# Patient Record
Sex: Female | Born: 2000 | Race: White | Hispanic: No | Marital: Married
Health system: Southern US, Community
[De-identification: ages and names within clinical notes are randomized; demographics above are authoritative.]

## PROBLEM LIST (undated history)

## (undated) DIAGNOSIS — G809 Cerebral palsy, unspecified: Secondary | ICD-10-CM

## (undated) HISTORY — DX: Cerebral palsy, unspecified: G80.9

---
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
## 2018-05-23 ENCOUNTER — Other Ambulatory Visit: Admission: RE | Admit: 2018-05-23 | Payer: Self-pay | Source: Ambulatory Visit | Admitting: *Deleted

## 2018-05-25 ENCOUNTER — Other Ambulatory Visit
Admission: RE | Admit: 2018-05-25 | Discharge: 2018-05-25 | Disposition: A | Discharge status: Home / Self Care | Payer: Medicaid Other | Source: Ambulatory Visit | Attending: Pediatrics | Admitting: Pediatrics

## 2018-05-25 DIAGNOSIS — J9611 Chronic respiratory failure with hypoxia: Secondary | ICD-10-CM | POA: Diagnosis not present

## 2018-05-25 LAB — BASIC METABOLIC PANEL
Anion gap: 14 (ref 5–15)
BUN: 38 mg/dL — ABNORMAL HIGH (ref 6–20)
CO2: 33 mmol/L — ABNORMAL HIGH (ref 22–32)
Calcium: 9 mg/dL (ref 8.9–10.3)
Chloride: 83 mmol/L — ABNORMAL LOW (ref 98–111)
Creatinine, Ser: 0.47 mg/dL (ref 0.44–1.00)
GFR calc Af Amer: >60 mL/min (ref >60)
GFR calc non Af Amer: >60 mL/min (ref >60)
Glucose, Bld: 72 mg/dL (ref 70–99)
Potassium: 4.3 mmol/L (ref 3.5–5.1)
Sodium: 130 mmol/L — ABNORMAL LOW (ref 135–145)

## 2018-06-01 ENCOUNTER — Other Ambulatory Visit
Admission: RE | Admit: 2018-06-01 | Discharge: 2018-06-01 | Disposition: A | Discharge status: Home / Self Care | Payer: Medicaid Other | Source: Ambulatory Visit | Attending: Pediatrics | Admitting: Pediatrics

## 2018-06-01 DIAGNOSIS — J9612 Chronic respiratory failure with hypercapnia: Secondary | ICD-10-CM | POA: Insufficient documentation

## 2018-06-01 DIAGNOSIS — J9611 Chronic respiratory failure with hypoxia: Secondary | ICD-10-CM | POA: Insufficient documentation

## 2018-06-01 LAB — BASIC METABOLIC PANEL
Anion gap: 12 (ref 5–15)
BUN: 44 mg/dL — ABNORMAL HIGH (ref 6–20)
CO2: 36 mmol/L — ABNORMAL HIGH (ref 22–32)
Calcium: 9 mg/dL (ref 8.9–10.3)
Chloride: 82 mmol/L — ABNORMAL LOW (ref 98–111)
Creatinine, Ser: 0.42 mg/dL — ABNORMAL LOW (ref 0.44–1.00)
GFR calc Af Amer: >60 mL/min (ref >60)
GFR calc non Af Amer: >60 mL/min (ref >60)
Glucose, Bld: 79 mg/dL (ref 70–99)
Potassium: 3.9 mmol/L (ref 3.5–5.1)
Sodium: 130 mmol/L — ABNORMAL LOW (ref 135–145)

## 2018-06-08 ENCOUNTER — Other Ambulatory Visit
Admission: RE | Admit: 2018-06-08 | Discharge: 2018-06-08 | Disposition: A | Discharge status: Home / Self Care | Payer: Medicaid Other | Source: Ambulatory Visit | Attending: Pediatrics | Admitting: Pediatrics

## 2018-06-08 DIAGNOSIS — J9611 Chronic respiratory failure with hypoxia: Secondary | ICD-10-CM | POA: Insufficient documentation

## 2018-06-08 DIAGNOSIS — J9612 Chronic respiratory failure with hypercapnia: Secondary | ICD-10-CM | POA: Diagnosis present

## 2018-06-08 LAB — BASIC METABOLIC PANEL
Anion gap: 14 (ref 5–15)
BUN: 48 mg/dL — ABNORMAL HIGH (ref 6–20)
CO2: 36 mmol/L — ABNORMAL HIGH (ref 22–32)
Calcium: 8.8 mg/dL — ABNORMAL LOW (ref 8.9–10.3)
Chloride: 81 mmol/L — ABNORMAL LOW (ref 98–111)
Creatinine, Ser: 0.68 mg/dL (ref 0.44–1.00)
GFR calc Af Amer: >60 mL/min (ref >60)
GFR calc non Af Amer: >60 mL/min (ref >60)
Glucose, Bld: 83 mg/dL (ref 70–99)
Potassium: 3.6 mmol/L (ref 3.5–5.1)
Sodium: 131 mmol/L — ABNORMAL LOW (ref 135–145)

## 2018-06-14 ENCOUNTER — Other Ambulatory Visit
Admission: RE | Admit: 2018-06-14 | Discharge: 2018-06-14 | Disposition: A | Discharge status: Home / Self Care | Payer: Medicaid Other | Source: Ambulatory Visit | Attending: Pediatrics | Admitting: Pediatrics

## 2018-06-14 DIAGNOSIS — J9612 Chronic respiratory failure with hypercapnia: Secondary | ICD-10-CM | POA: Insufficient documentation

## 2018-06-14 DIAGNOSIS — J9611 Chronic respiratory failure with hypoxia: Secondary | ICD-10-CM | POA: Insufficient documentation

## 2018-06-14 LAB — BASIC METABOLIC PANEL
Anion gap: 12 (ref 5–15)
BUN: 46 mg/dL — ABNORMAL HIGH (ref 6–20)
CO2: 37 mmol/L — ABNORMAL HIGH (ref 22–32)
Calcium: 9 mg/dL (ref 8.9–10.3)
Chloride: 82 mmol/L — ABNORMAL LOW (ref 98–111)
Creatinine, Ser: 0.49 mg/dL (ref 0.44–1.00)
GFR calc Af Amer: >60 mL/min (ref >60)
GFR calc non Af Amer: >60 mL/min (ref >60)
Glucose, Bld: 84 mg/dL (ref 70–99)
Potassium: 3.8 mmol/L (ref 3.5–5.1)
Sodium: 131 mmol/L — ABNORMAL LOW (ref 135–145)

## 2018-06-22 ENCOUNTER — Other Ambulatory Visit
Admission: RE | Admit: 2018-06-22 | Discharge: 2018-06-22 | Disposition: A | Discharge status: Home / Self Care | Payer: Medicaid Other | Source: Ambulatory Visit | Attending: Pediatrics | Admitting: Pediatrics

## 2018-06-22 DIAGNOSIS — J9612 Chronic respiratory failure with hypercapnia: Secondary | ICD-10-CM | POA: Diagnosis present

## 2018-06-22 DIAGNOSIS — J9611 Chronic respiratory failure with hypoxia: Secondary | ICD-10-CM | POA: Insufficient documentation

## 2018-06-22 LAB — BASIC METABOLIC PANEL
Anion gap: 8 (ref 5–15)
BUN: 24 mg/dL — ABNORMAL HIGH (ref 6–20)
CO2: 32 mmol/L (ref 22–32)
Calcium: 8.6 mg/dL — ABNORMAL LOW (ref 8.9–10.3)
Chloride: 89 mmol/L — ABNORMAL LOW (ref 98–111)
Creatinine, Ser: 0.47 mg/dL (ref 0.44–1.00)
GFR calc Af Amer: >60 mL/min (ref >60)
GFR calc non Af Amer: >60 mL/min (ref >60)
Glucose, Bld: 75 mg/dL (ref 70–99)
Potassium: 4.1 mmol/L (ref 3.5–5.1)
Sodium: 129 mmol/L — ABNORMAL LOW (ref 135–145)

## 2018-06-29 ENCOUNTER — Other Ambulatory Visit
Admission: RE | Admit: 2018-06-29 | Discharge: 2018-06-29 | Disposition: A | Discharge status: Home / Self Care | Payer: Medicaid Other | Source: Other Acute Inpatient Hospital | Attending: Pediatrics | Admitting: Pediatrics

## 2018-06-29 DIAGNOSIS — J9612 Chronic respiratory failure with hypercapnia: Secondary | ICD-10-CM | POA: Diagnosis present

## 2018-06-29 DIAGNOSIS — J9611 Chronic respiratory failure with hypoxia: Secondary | ICD-10-CM | POA: Diagnosis not present

## 2018-06-29 LAB — BASIC METABOLIC PANEL
Anion gap: 11 (ref 5–15)
BUN: 28 mg/dL — ABNORMAL HIGH (ref 6–20)
CO2: 32 mmol/L (ref 22–32)
Calcium: 8.6 mg/dL — ABNORMAL LOW (ref 8.9–10.3)
Chloride: 92 mmol/L — ABNORMAL LOW (ref 98–111)
Creatinine, Ser: 0.51 mg/dL (ref 0.44–1.00)
GFR calc Af Amer: >60 mL/min (ref >60)
GFR calc non Af Amer: >60 mL/min (ref >60)
Glucose, Bld: 84 mg/dL (ref 70–99)
Potassium: 5 mmol/L (ref 3.5–5.1)
Sodium: 135 mmol/L (ref 135–145)

## 2018-07-06 ENCOUNTER — Other Ambulatory Visit
Admission: RE | Admit: 2018-07-06 | Discharge: 2018-07-06 | Disposition: A | Discharge status: Home / Self Care | Payer: Medicaid Other | Source: Ambulatory Visit | Attending: Pediatrics | Admitting: Pediatrics

## 2018-07-06 DIAGNOSIS — J9612 Chronic respiratory failure with hypercapnia: Secondary | ICD-10-CM | POA: Diagnosis not present

## 2018-07-06 DIAGNOSIS — J9611 Chronic respiratory failure with hypoxia: Secondary | ICD-10-CM | POA: Insufficient documentation

## 2018-07-06 LAB — BASIC METABOLIC PANEL
Anion gap: 9 (ref 5–15)
BUN: 35 mg/dL — ABNORMAL HIGH (ref 6–20)
CO2: 36 mmol/L — ABNORMAL HIGH (ref 22–32)
Calcium: 9 mg/dL (ref 8.9–10.3)
Chloride: 91 mmol/L — ABNORMAL LOW (ref 98–111)
Creatinine, Ser: 0.56 mg/dL (ref 0.44–1.00)
GFR calc Af Amer: >60 mL/min (ref >60)
GFR calc non Af Amer: >60 mL/min (ref >60)
Glucose, Bld: 71 mg/dL (ref 70–99)
Potassium: 4.4 mmol/L (ref 3.5–5.1)
Sodium: 136 mmol/L (ref 135–145)

## 2018-07-13 ENCOUNTER — Other Ambulatory Visit
Admission: RE | Admit: 2018-07-13 | Discharge: 2018-07-13 | Disposition: A | Discharge status: Home / Self Care | Payer: Medicaid Other | Source: Ambulatory Visit | Attending: Pediatrics | Admitting: Pediatrics

## 2018-07-13 DIAGNOSIS — J9612 Chronic respiratory failure with hypercapnia: Secondary | ICD-10-CM | POA: Insufficient documentation

## 2018-07-13 DIAGNOSIS — J9611 Chronic respiratory failure with hypoxia: Secondary | ICD-10-CM | POA: Diagnosis not present

## 2018-07-13 LAB — BASIC METABOLIC PANEL
Anion gap: 11 (ref 5–15)
BUN: 40 mg/dL — ABNORMAL HIGH (ref 6–20)
CO2: 34 mmol/L — ABNORMAL HIGH (ref 22–32)
Calcium: 8.7 mg/dL — ABNORMAL LOW (ref 8.9–10.3)
Chloride: 92 mmol/L — ABNORMAL LOW (ref 98–111)
Creatinine, Ser: 0.59 mg/dL (ref 0.44–1.00)
GFR calc Af Amer: >60 mL/min (ref >60)
GFR calc non Af Amer: >60 mL/min (ref >60)
Glucose, Bld: 81 mg/dL (ref 70–99)
Potassium: 4.5 mmol/L (ref 3.5–5.1)
Sodium: 137 mmol/L (ref 135–145)

## 2018-07-21 ENCOUNTER — Other Ambulatory Visit
Admission: RE | Admit: 2018-07-21 | Discharge: 2018-07-21 | Disposition: A | Discharge status: Home / Self Care | Payer: Medicaid Other | Source: Ambulatory Visit | Attending: Pediatrics | Admitting: Pediatrics

## 2018-07-21 DIAGNOSIS — J9611 Chronic respiratory failure with hypoxia: Secondary | ICD-10-CM | POA: Diagnosis not present

## 2018-07-21 DIAGNOSIS — J9612 Chronic respiratory failure with hypercapnia: Secondary | ICD-10-CM | POA: Insufficient documentation

## 2018-07-21 LAB — BASIC METABOLIC PANEL
Anion gap: 11 (ref 5–15)
BUN: 35 mg/dL — ABNORMAL HIGH (ref 6–20)
CO2: 36 mmol/L — ABNORMAL HIGH (ref 22–32)
Calcium: 8.6 mg/dL — ABNORMAL LOW (ref 8.9–10.3)
Chloride: 89 mmol/L — ABNORMAL LOW (ref 98–111)
Creatinine, Ser: 0.39 mg/dL — ABNORMAL LOW (ref 0.44–1.00)
GFR calc Af Amer: >60 mL/min (ref >60)
GFR calc non Af Amer: >60 mL/min (ref >60)
Glucose, Bld: 78 mg/dL (ref 70–99)
Potassium: 4.2 mmol/L (ref 3.5–5.1)
Sodium: 136 mmol/L (ref 135–145)

## 2018-07-28 ENCOUNTER — Other Ambulatory Visit
Admission: RE | Admit: 2018-07-28 | Discharge: 2018-07-28 | Disposition: A | Discharge status: Home / Self Care | Payer: Medicaid Other | Source: Ambulatory Visit | Attending: Pediatrics | Admitting: Pediatrics

## 2018-07-28 DIAGNOSIS — J9612 Chronic respiratory failure with hypercapnia: Secondary | ICD-10-CM | POA: Insufficient documentation

## 2018-07-28 DIAGNOSIS — J9611 Chronic respiratory failure with hypoxia: Secondary | ICD-10-CM | POA: Insufficient documentation

## 2018-07-28 LAB — BASIC METABOLIC PANEL
Anion gap: 11 (ref 5–15)
BUN: 29 mg/dL — ABNORMAL HIGH (ref 6–20)
CO2: 32 mmol/L (ref 22–32)
Calcium: 8.5 mg/dL — ABNORMAL LOW (ref 8.9–10.3)
Chloride: 91 mmol/L — ABNORMAL LOW (ref 98–111)
Creatinine, Ser: 0.47 mg/dL (ref 0.44–1.00)
GFR calc Af Amer: >60 mL/min (ref >60)
GFR calc non Af Amer: >60 mL/min (ref >60)
Glucose, Bld: 66 mg/dL — ABNORMAL LOW (ref 70–99)
Potassium: 4.5 mmol/L (ref 3.5–5.1)
Sodium: 134 mmol/L — ABNORMAL LOW (ref 135–145)

## 2018-08-07 DIAGNOSIS — Z9911 Dependence on respirator [ventilator] status: Secondary | ICD-10-CM | POA: Insufficient documentation

## 2018-08-10 ENCOUNTER — Other Ambulatory Visit
Admission: RE | Admit: 2018-08-10 | Discharge: 2018-08-10 | Disposition: A | Discharge status: Home / Self Care | Payer: Medicaid Other | Source: Ambulatory Visit | Attending: Pediatrics | Admitting: Pediatrics

## 2018-08-10 DIAGNOSIS — J9612 Chronic respiratory failure with hypercapnia: Secondary | ICD-10-CM | POA: Insufficient documentation

## 2018-08-10 DIAGNOSIS — J9611 Chronic respiratory failure with hypoxia: Secondary | ICD-10-CM | POA: Insufficient documentation

## 2018-08-10 LAB — BASIC METABOLIC PANEL
Anion gap: 10 (ref 5–15)
BUN: 44 mg/dL — ABNORMAL HIGH (ref 6–20)
CO2: 38 mmol/L — ABNORMAL HIGH (ref 22–32)
Calcium: 9.2 mg/dL (ref 8.9–10.3)
Chloride: 94 mmol/L — ABNORMAL LOW (ref 98–111)
Creatinine, Ser: 0.57 mg/dL (ref 0.44–1.00)
GFR calc Af Amer: >60 mL/min (ref >60)
GFR calc non Af Amer: >60 mL/min (ref >60)
Glucose, Bld: 84 mg/dL (ref 70–99)
Potassium: 3.9 mmol/L (ref 3.5–5.1)
Sodium: 142 mmol/L (ref 135–145)

## 2018-08-18 ENCOUNTER — Other Ambulatory Visit
Admission: RE | Admit: 2018-08-18 | Discharge: 2018-08-18 | Disposition: A | Discharge status: Home / Self Care | Payer: Medicaid Other | Source: Ambulatory Visit | Attending: Pediatrics | Admitting: Pediatrics

## 2018-08-18 DIAGNOSIS — J9611 Chronic respiratory failure with hypoxia: Secondary | ICD-10-CM | POA: Insufficient documentation

## 2018-08-18 DIAGNOSIS — J9612 Chronic respiratory failure with hypercapnia: Secondary | ICD-10-CM | POA: Insufficient documentation

## 2018-08-18 LAB — BASIC METABOLIC PANEL
Anion gap: 11 (ref 5–15)
BUN: 46 mg/dL — ABNORMAL HIGH (ref 6–20)
CO2: 34 mmol/L — ABNORMAL HIGH (ref 22–32)
Calcium: 8.8 mg/dL — ABNORMAL LOW (ref 8.9–10.3)
Chloride: 97 mmol/L — ABNORMAL LOW (ref 98–111)
Creatinine, Ser: 0.6 mg/dL (ref 0.44–1.00)
GFR calc Af Amer: >60 mL/min (ref >60)
GFR calc non Af Amer: >60 mL/min (ref >60)
Glucose, Bld: 85 mg/dL (ref 70–99)
Potassium: 4.2 mmol/L (ref 3.5–5.1)
Sodium: 142 mmol/L (ref 135–145)

## 2018-09-01 ENCOUNTER — Other Ambulatory Visit
Admission: RE | Admit: 2018-09-01 | Discharge: 2018-09-01 | Disposition: A | Discharge status: Home / Self Care | Payer: Medicaid Other | Source: Ambulatory Visit | Attending: Pediatrics | Admitting: Pediatrics

## 2018-09-01 DIAGNOSIS — J9611 Chronic respiratory failure with hypoxia: Secondary | ICD-10-CM | POA: Insufficient documentation

## 2018-09-01 DIAGNOSIS — J9612 Chronic respiratory failure with hypercapnia: Secondary | ICD-10-CM | POA: Insufficient documentation

## 2018-09-01 LAB — BASIC METABOLIC PANEL
Anion gap: 15 (ref 5–15)
BUN: 31 mg/dL — ABNORMAL HIGH (ref 6–20)
CO2: 29 mmol/L (ref 22–32)
Calcium: 8.8 mg/dL — ABNORMAL LOW (ref 8.9–10.3)
Chloride: 91 mmol/L — ABNORMAL LOW (ref 98–111)
Creatinine, Ser: 0.58 mg/dL (ref 0.44–1.00)
GFR calc Af Amer: >60 mL/min (ref >60)
GFR calc non Af Amer: >60 mL/min (ref >60)
Glucose, Bld: 73 mg/dL (ref 70–99)
Potassium: 4.3 mmol/L (ref 3.5–5.1)
Sodium: 135 mmol/L (ref 135–145)

## 2018-11-10 ENCOUNTER — Other Ambulatory Visit
Admission: RE | Admit: 2018-11-10 | Discharge: 2018-11-10 | Disposition: A | Discharge status: Home / Self Care | Payer: Medicaid Other | Attending: Pediatrics | Admitting: Pediatrics

## 2018-11-10 DIAGNOSIS — J9611 Chronic respiratory failure with hypoxia: Secondary | ICD-10-CM | POA: Diagnosis not present

## 2018-11-10 DIAGNOSIS — J9612 Chronic respiratory failure with hypercapnia: Secondary | ICD-10-CM | POA: Diagnosis not present

## 2018-11-10 LAB — BASIC METABOLIC PANEL
Anion gap: 10 (ref 5–15)
BUN: 34 mg/dL — ABNORMAL HIGH (ref 6–20)
CO2: 36 mmol/L — ABNORMAL HIGH (ref 22–32)
Calcium: 8.7 mg/dL — ABNORMAL LOW (ref 8.9–10.3)
Chloride: 89 mmol/L — ABNORMAL LOW (ref 98–111)
Creatinine, Ser: 0.46 mg/dL (ref 0.44–1.00)
GFR calc Af Amer: >60 mL/min (ref >60)
GFR calc non Af Amer: >60 mL/min (ref >60)
Glucose, Bld: 97 mg/dL (ref 70–99)
Potassium: 4.1 mmol/L (ref 3.5–5.1)
Sodium: 135 mmol/L (ref 135–145)

## 2019-06-05 ENCOUNTER — Other Ambulatory Visit
Admission: RE | Admit: 2019-06-05 | Discharge: 2019-06-05 | Disposition: A | Discharge status: Home / Self Care | Payer: Medicaid Other | Source: Ambulatory Visit | Attending: Pediatrics | Admitting: Pediatrics

## 2019-06-05 DIAGNOSIS — J9611 Chronic respiratory failure with hypoxia: Secondary | ICD-10-CM | POA: Insufficient documentation

## 2019-06-05 DIAGNOSIS — J9612 Chronic respiratory failure with hypercapnia: Secondary | ICD-10-CM | POA: Insufficient documentation

## 2019-06-06 LAB — BASIC METABOLIC PANEL
Anion gap: 15 (ref 5–15)
BUN: 49 mg/dL — ABNORMAL HIGH (ref 6–20)
CO2: 38 mmol/L — ABNORMAL HIGH (ref 22–32)
Calcium: 9.2 mg/dL (ref 8.9–10.3)
Chloride: 86 mmol/L — ABNORMAL LOW (ref 98–111)
Creatinine, Ser: 0.63 mg/dL (ref 0.44–1.00)
GFR calc Af Amer: >60 mL/min (ref >60)
GFR calc non Af Amer: >60 mL/min (ref >60)
Glucose, Bld: 104 mg/dL — ABNORMAL HIGH (ref 70–99)
Potassium: 3.8 mmol/L (ref 3.5–5.1)
Sodium: 139 mmol/L (ref 135–145)

## 2019-08-13 ENCOUNTER — Other Ambulatory Visit
Admission: RE | Admit: 2019-08-13 | Discharge: 2019-08-13 | Disposition: A | Discharge status: Home / Self Care | Payer: Medicaid Other | Source: Ambulatory Visit | Attending: Student | Admitting: Student

## 2019-08-13 DIAGNOSIS — J9611 Chronic respiratory failure with hypoxia: Secondary | ICD-10-CM | POA: Diagnosis present

## 2019-08-13 DIAGNOSIS — J9612 Chronic respiratory failure with hypercapnia: Secondary | ICD-10-CM | POA: Diagnosis present

## 2019-08-13 LAB — BASIC METABOLIC PANEL
Anion gap: 16 — ABNORMAL HIGH (ref 5–15)
BUN: 62 mg/dL — ABNORMAL HIGH (ref 6–20)
CO2: 40 mmol/L — ABNORMAL HIGH (ref 22–32)
Calcium: 9.6 mg/dL (ref 8.9–10.3)
Chloride: 88 mmol/L — ABNORMAL LOW (ref 98–111)
Creatinine, Ser: 0.73 mg/dL (ref 0.44–1.00)
GFR calc Af Amer: >60 mL/min (ref >60)
GFR calc non Af Amer: >60 mL/min (ref >60)
Glucose, Bld: 135 mg/dL — ABNORMAL HIGH (ref 70–99)
Potassium: 3.4 mmol/L — ABNORMAL LOW (ref 3.5–5.1)
Sodium: 144 mmol/L (ref 135–145)

## 2019-08-20 ENCOUNTER — Other Ambulatory Visit
Admission: RE | Admit: 2019-08-20 | Discharge: 2019-08-20 | Disposition: A | Discharge status: Home / Self Care | Payer: Medicaid Other | Source: Ambulatory Visit | Attending: Student | Admitting: Student

## 2019-08-20 DIAGNOSIS — E878 Other disorders of electrolyte and fluid balance, not elsewhere classified: Secondary | ICD-10-CM | POA: Diagnosis not present

## 2019-08-20 DIAGNOSIS — J9612 Chronic respiratory failure with hypercapnia: Secondary | ICD-10-CM | POA: Insufficient documentation

## 2019-08-20 DIAGNOSIS — J9611 Chronic respiratory failure with hypoxia: Secondary | ICD-10-CM | POA: Diagnosis not present

## 2019-08-20 DIAGNOSIS — R600 Localized edema: Secondary | ICD-10-CM | POA: Diagnosis not present

## 2019-08-20 DIAGNOSIS — Z7189 Other specified counseling: Secondary | ICD-10-CM | POA: Insufficient documentation

## 2019-08-20 LAB — COMPREHENSIVE METABOLIC PANEL
ALT: 24 U/L (ref 0–44)
AST: 25 U/L (ref 15–41)
Albumin: 2.5 g/dL — ABNORMAL LOW (ref 3.5–5.0)
Alkaline Phosphatase: 38 U/L (ref 38–126)
Anion gap: 12 (ref 5–15)
BUN: 44 mg/dL — ABNORMAL HIGH (ref 6–20)
CO2: 31 mmol/L (ref 22–32)
Calcium: 9.2 mg/dL (ref 8.9–10.3)
Chloride: 105 mmol/L (ref 98–111)
Creatinine, Ser: 0.53 mg/dL (ref 0.44–1.00)
GFR calc Af Amer: >60 mL/min (ref >60)
GFR calc non Af Amer: >60 mL/min (ref >60)
Glucose, Bld: 98 mg/dL (ref 70–99)
Potassium: 4.2 mmol/L (ref 3.5–5.1)
Sodium: 148 mmol/L — ABNORMAL HIGH (ref 135–145)
Total Bilirubin: 0.6 mg/dL (ref 0.3–1.2)
Total Protein: 6 g/dL — ABNORMAL LOW (ref 6.5–8.1)

## 2019-08-20 LAB — PROTEIN / CREATININE RATIO, URINE
Creatinine, Urine: 41 mg/dL
Protein Creatinine Ratio: 1.22 mg/mg{Cre} — ABNORMAL HIGH (ref 0.00–0.15)
Total Protein, Urine: 50 mg/dL

## 2019-09-05 ENCOUNTER — Other Ambulatory Visit
Admission: RE | Admit: 2019-09-05 | Discharge: 2019-09-05 | Disposition: A | Discharge status: Home / Self Care | Payer: Medicaid Other | Source: Ambulatory Visit | Attending: Student | Admitting: Student

## 2019-09-05 DIAGNOSIS — Z7189 Other specified counseling: Secondary | ICD-10-CM | POA: Insufficient documentation

## 2019-09-05 DIAGNOSIS — E878 Other disorders of electrolyte and fluid balance, not elsewhere classified: Secondary | ICD-10-CM | POA: Diagnosis present

## 2019-09-05 DIAGNOSIS — E861 Hypovolemia: Secondary | ICD-10-CM | POA: Insufficient documentation

## 2019-09-05 LAB — COMPREHENSIVE METABOLIC PANEL
ALT: 23 U/L (ref 0–44)
AST: 31 U/L (ref 15–41)
Albumin: 2.3 g/dL — ABNORMAL LOW (ref 3.5–5.0)
Alkaline Phosphatase: 36 U/L — ABNORMAL LOW (ref 38–126)
Anion gap: 11 (ref 5–15)
BUN: 29 mg/dL — ABNORMAL HIGH (ref 6–20)
CO2: 29 mmol/L (ref 22–32)
Calcium: 8.7 mg/dL — ABNORMAL LOW (ref 8.9–10.3)
Chloride: 103 mmol/L (ref 98–111)
Creatinine, Ser: 0.54 mg/dL (ref 0.44–1.00)
GFR calc Af Amer: >60 mL/min (ref >60)
GFR calc non Af Amer: >60 mL/min (ref >60)
Glucose, Bld: 112 mg/dL — ABNORMAL HIGH (ref 70–99)
Potassium: 4.6 mmol/L (ref 3.5–5.1)
Sodium: 143 mmol/L (ref 135–145)
Total Bilirubin: 0.6 mg/dL (ref 0.3–1.2)
Total Protein: 5.5 g/dL — ABNORMAL LOW (ref 6.5–8.1)

## 2019-09-24 ENCOUNTER — Other Ambulatory Visit
Admission: RE | Admit: 2019-09-24 | Discharge: 2019-09-24 | Disposition: A | Discharge status: Home / Self Care | Payer: Medicaid Other | Source: Ambulatory Visit | Attending: Student | Admitting: Student

## 2019-09-24 DIAGNOSIS — Z7189 Other specified counseling: Secondary | ICD-10-CM | POA: Insufficient documentation

## 2019-09-24 DIAGNOSIS — E878 Other disorders of electrolyte and fluid balance, not elsewhere classified: Secondary | ICD-10-CM | POA: Insufficient documentation

## 2019-09-24 DIAGNOSIS — M419 Scoliosis, unspecified: Secondary | ICD-10-CM | POA: Insufficient documentation

## 2019-09-24 DIAGNOSIS — J984 Other disorders of lung: Secondary | ICD-10-CM | POA: Diagnosis present

## 2019-09-24 LAB — COMPREHENSIVE METABOLIC PANEL
ALT: 13 U/L (ref 0–44)
AST: 25 U/L (ref 15–41)
Albumin: 2.2 g/dL — ABNORMAL LOW (ref 3.5–5.0)
Alkaline Phosphatase: 41 U/L (ref 38–126)
Anion gap: 11 (ref 5–15)
BUN: 29 mg/dL — ABNORMAL HIGH (ref 6–20)
CO2: 31 mmol/L (ref 22–32)
Calcium: 8.7 mg/dL — ABNORMAL LOW (ref 8.9–10.3)
Chloride: 97 mmol/L — ABNORMAL LOW (ref 98–111)
Creatinine, Ser: 0.53 mg/dL (ref 0.44–1.00)
GFR calc Af Amer: >60 mL/min (ref >60)
GFR calc non Af Amer: >60 mL/min (ref >60)
Glucose, Bld: 100 mg/dL — ABNORMAL HIGH (ref 70–99)
Potassium: 4.5 mmol/L (ref 3.5–5.1)
Sodium: 139 mmol/L (ref 135–145)
Total Bilirubin: 0.7 mg/dL (ref 0.3–1.2)
Total Protein: 5.6 g/dL — ABNORMAL LOW (ref 6.5–8.1)

## 2019-10-10 ENCOUNTER — Other Ambulatory Visit
Admission: RE | Admit: 2019-10-10 | Discharge: 2019-10-10 | Disposition: A | Discharge status: Home / Self Care | Payer: Medicaid Other | Source: Ambulatory Visit | Attending: Student | Admitting: Student

## 2019-10-10 DIAGNOSIS — Z7189 Other specified counseling: Secondary | ICD-10-CM | POA: Diagnosis present

## 2019-10-10 DIAGNOSIS — E878 Other disorders of electrolyte and fluid balance, not elsewhere classified: Secondary | ICD-10-CM | POA: Insufficient documentation

## 2019-10-10 LAB — BASIC METABOLIC PANEL WITH GFR
Anion gap: 13 (ref 5–15)
BUN: 46 mg/dL — ABNORMAL HIGH (ref 6–20)
CO2: 31 mmol/L (ref 22–32)
Calcium: 9.2 mg/dL (ref 8.9–10.3)
Chloride: 98 mmol/L (ref 98–111)
Creatinine, Ser: 0.52 mg/dL (ref 0.44–1.00)
GFR calc Af Amer: >60 mL/min
GFR calc non Af Amer: >60 mL/min
Glucose, Bld: 100 mg/dL — ABNORMAL HIGH (ref 70–99)
Potassium: 4.4 mmol/L (ref 3.5–5.1)
Sodium: 142 mmol/L (ref 135–145)

## 2019-10-21 ENCOUNTER — Other Ambulatory Visit
Admission: RE | Admit: 2019-10-21 | Discharge: 2019-10-21 | Disposition: A | Discharge status: Home / Self Care | Payer: Medicaid Other | Source: Ambulatory Visit | Attending: Student | Admitting: Student

## 2019-10-21 DIAGNOSIS — Z7189 Other specified counseling: Secondary | ICD-10-CM | POA: Insufficient documentation

## 2019-10-21 DIAGNOSIS — E878 Other disorders of electrolyte and fluid balance, not elsewhere classified: Secondary | ICD-10-CM | POA: Insufficient documentation

## 2019-10-21 LAB — BASIC METABOLIC PANEL
Anion gap: 11 (ref 5–15)
BUN: 42 mg/dL — ABNORMAL HIGH (ref 6–20)
CO2: 28 mmol/L (ref 22–32)
Calcium: 8.6 mg/dL — ABNORMAL LOW (ref 8.9–10.3)
Chloride: 96 mmol/L — ABNORMAL LOW (ref 98–111)
Creatinine, Ser: 0.5 mg/dL (ref 0.44–1.00)
GFR calc Af Amer: >60 mL/min (ref >60)
GFR calc non Af Amer: >60 mL/min (ref >60)
Glucose, Bld: 75 mg/dL (ref 70–99)
Potassium: 5.1 mmol/L (ref 3.5–5.1)
Sodium: 135 mmol/L (ref 135–145)

## 2019-10-30 ENCOUNTER — Other Ambulatory Visit
Admission: RE | Admit: 2019-10-30 | Discharge: 2019-10-30 | Disposition: A | Discharge status: Home / Self Care | Payer: Medicaid Other | Source: Ambulatory Visit | Attending: Student | Admitting: Student

## 2019-10-30 DIAGNOSIS — Z7189 Other specified counseling: Secondary | ICD-10-CM | POA: Diagnosis present

## 2019-10-30 DIAGNOSIS — E878 Other disorders of electrolyte and fluid balance, not elsewhere classified: Secondary | ICD-10-CM | POA: Insufficient documentation

## 2019-10-30 LAB — COMPREHENSIVE METABOLIC PANEL
ALT: 15 U/L (ref 0–44)
AST: 22 U/L (ref 15–41)
Albumin: 2.4 g/dL — ABNORMAL LOW (ref 3.5–5.0)
Alkaline Phosphatase: 68 U/L (ref 38–126)
Anion gap: 15 (ref 5–15)
BUN: 35 mg/dL — ABNORMAL HIGH (ref 6–20)
CO2: 25 mmol/L (ref 22–32)
Calcium: 8.8 mg/dL — ABNORMAL LOW (ref 8.9–10.3)
Chloride: 99 mmol/L (ref 98–111)
Creatinine, Ser: 0.61 mg/dL (ref 0.44–1.00)
GFR calc Af Amer: >60 mL/min (ref >60)
GFR calc non Af Amer: >60 mL/min (ref >60)
Glucose, Bld: 97 mg/dL (ref 70–99)
Potassium: 4 mmol/L (ref 3.5–5.1)
Sodium: 139 mmol/L (ref 135–145)
Total Bilirubin: 0.4 mg/dL (ref 0.3–1.2)
Total Protein: 6.5 g/dL (ref 6.5–8.1)

## 2019-11-18 ENCOUNTER — Other Ambulatory Visit
Admission: RE | Admit: 2019-11-18 | Discharge: 2019-11-18 | Disposition: A | Discharge status: Home / Self Care | Payer: Medicaid Other | Source: Ambulatory Visit | Attending: Student | Admitting: Student

## 2019-11-18 DIAGNOSIS — J9611 Chronic respiratory failure with hypoxia: Secondary | ICD-10-CM | POA: Insufficient documentation

## 2019-11-18 DIAGNOSIS — Z9911 Dependence on respirator [ventilator] status: Secondary | ICD-10-CM | POA: Insufficient documentation

## 2019-11-18 DIAGNOSIS — E878 Other disorders of electrolyte and fluid balance, not elsewhere classified: Secondary | ICD-10-CM | POA: Insufficient documentation

## 2019-11-18 DIAGNOSIS — J9612 Chronic respiratory failure with hypercapnia: Secondary | ICD-10-CM | POA: Diagnosis present

## 2019-11-18 LAB — COMPREHENSIVE METABOLIC PANEL
ALT: 14 U/L (ref 0–44)
AST: 21 U/L (ref 15–41)
Albumin: 2.3 g/dL — ABNORMAL LOW (ref 3.5–5.0)
Alkaline Phosphatase: 63 U/L (ref 38–126)
Anion gap: 11 (ref 5–15)
BUN: 38 mg/dL — ABNORMAL HIGH (ref 6–20)
CO2: 28 mmol/L (ref 22–32)
Calcium: 9.1 mg/dL (ref 8.9–10.3)
Chloride: 99 mmol/L (ref 98–111)
Creatinine, Ser: 0.57 mg/dL (ref 0.44–1.00)
GFR calc Af Amer: >60 mL/min (ref >60)
GFR calc non Af Amer: >60 mL/min (ref >60)
Glucose, Bld: 96 mg/dL (ref 70–99)
Potassium: 4.2 mmol/L (ref 3.5–5.1)
Sodium: 138 mmol/L (ref 135–145)
Total Bilirubin: 0.5 mg/dL (ref 0.3–1.2)
Total Protein: 6.3 g/dL — ABNORMAL LOW (ref 6.5–8.1)

## 2019-12-02 ENCOUNTER — Other Ambulatory Visit
Admission: RE | Admit: 2019-12-02 | Discharge: 2019-12-02 | Disposition: A | Discharge status: Home / Self Care | Payer: Medicaid Other | Source: Ambulatory Visit | Attending: Student | Admitting: Student

## 2019-12-02 DIAGNOSIS — Z7189 Other specified counseling: Secondary | ICD-10-CM | POA: Insufficient documentation

## 2019-12-02 DIAGNOSIS — J9612 Chronic respiratory failure with hypercapnia: Secondary | ICD-10-CM | POA: Insufficient documentation

## 2019-12-02 DIAGNOSIS — E878 Other disorders of electrolyte and fluid balance, not elsewhere classified: Secondary | ICD-10-CM | POA: Diagnosis present

## 2019-12-02 DIAGNOSIS — J9611 Chronic respiratory failure with hypoxia: Secondary | ICD-10-CM | POA: Insufficient documentation

## 2019-12-02 LAB — COMPREHENSIVE METABOLIC PANEL
ALT: 13 U/L (ref 0–44)
AST: 37 U/L (ref 15–41)
Albumin: 2.4 g/dL — ABNORMAL LOW (ref 3.5–5.0)
Alkaline Phosphatase: 58 U/L (ref 38–126)
Anion gap: 10 (ref 5–15)
BUN: 35 mg/dL — ABNORMAL HIGH (ref 6–20)
CO2: 29 mmol/L (ref 22–32)
Calcium: 8.8 mg/dL — ABNORMAL LOW (ref 8.9–10.3)
Chloride: 99 mmol/L (ref 98–111)
Creatinine, Ser: 0.66 mg/dL (ref 0.44–1.00)
GFR calc Af Amer: >60 mL/min (ref >60)
GFR calc non Af Amer: >60 mL/min (ref >60)
Glucose, Bld: 59 mg/dL — ABNORMAL LOW (ref 70–99)
Potassium: 4.8 mmol/L (ref 3.5–5.1)
Sodium: 138 mmol/L (ref 135–145)
Total Bilirubin: 0.8 mg/dL (ref 0.3–1.2)
Total Protein: 6.7 g/dL (ref 6.5–8.1)

## 2019-12-02 LAB — MAGNESIUM: Magnesium: 2.6 mg/dL — ABNORMAL HIGH (ref 1.7–2.4)

## 2019-12-05 ENCOUNTER — Telehealth: Payer: Self-pay | Admitting: Nurse Practitioner

## 2019-12-05 NOTE — Telephone Encounter (Signed)
Spoke with patient's mother Kristi Woods regarding Palliative services and she was in agreement with this.  I have scheduled a Telephone Consult for 01/07/20 @ 9 AM.

## 2019-12-30 ENCOUNTER — Other Ambulatory Visit
Admission: RE | Admit: 2019-12-30 | Discharge: 2019-12-30 | Disposition: A | Discharge status: Home / Self Care | Payer: Medicaid Other | Source: Ambulatory Visit | Attending: Student | Admitting: Student

## 2019-12-30 DIAGNOSIS — Z934 Other artificial openings of gastrointestinal tract status: Secondary | ICD-10-CM | POA: Diagnosis present

## 2019-12-30 DIAGNOSIS — Z7189 Other specified counseling: Secondary | ICD-10-CM | POA: Diagnosis present

## 2019-12-30 DIAGNOSIS — J9612 Chronic respiratory failure with hypercapnia: Secondary | ICD-10-CM | POA: Diagnosis present

## 2019-12-30 DIAGNOSIS — E878 Other disorders of electrolyte and fluid balance, not elsewhere classified: Secondary | ICD-10-CM | POA: Insufficient documentation

## 2019-12-30 DIAGNOSIS — J9611 Chronic respiratory failure with hypoxia: Secondary | ICD-10-CM | POA: Insufficient documentation

## 2019-12-30 LAB — COMPREHENSIVE METABOLIC PANEL
ALT: 18 U/L (ref 0–44)
AST: 33 U/L (ref 15–41)
Albumin: 2.4 g/dL — ABNORMAL LOW (ref 3.5–5.0)
Alkaline Phosphatase: 64 U/L (ref 38–126)
Anion gap: 10 (ref 5–15)
BUN: 41 mg/dL — ABNORMAL HIGH (ref 6–20)
CO2: 30 mmol/L (ref 22–32)
Calcium: 9.1 mg/dL (ref 8.9–10.3)
Chloride: 95 mmol/L — ABNORMAL LOW (ref 98–111)
Creatinine, Ser: 0.65 mg/dL (ref 0.44–1.00)
GFR calc Af Amer: >60 mL/min (ref >60)
GFR calc non Af Amer: >60 mL/min (ref >60)
Glucose, Bld: 88 mg/dL (ref 70–99)
Potassium: 4.9 mmol/L (ref 3.5–5.1)
Sodium: 135 mmol/L (ref 135–145)
Total Bilirubin: 0.6 mg/dL (ref 0.3–1.2)
Total Protein: 6.8 g/dL (ref 6.5–8.1)

## 2019-12-30 LAB — C-REACTIVE PROTEIN: CRP: 1.2 mg/dL — ABNORMAL HIGH (ref <1.0)

## 2019-12-30 LAB — PREALBUMIN: Prealbumin: 27.5 mg/dL (ref 18–38)

## 2020-01-07 ENCOUNTER — Other Ambulatory Visit: Payer: Medicaid Other | Admitting: Nurse Practitioner

## 2020-01-07 ENCOUNTER — Other Ambulatory Visit: Payer: Self-pay

## 2020-01-07 ENCOUNTER — Encounter: Payer: Self-pay | Admitting: Nurse Practitioner

## 2020-01-07 DIAGNOSIS — Z515 Encounter for palliative care: Secondary | ICD-10-CM

## 2020-01-07 DIAGNOSIS — G809 Cerebral palsy, unspecified: Secondary | ICD-10-CM | POA: Insufficient documentation

## 2020-01-07 NOTE — Progress Notes (Signed)
[facility] Consult Note Telephone: [phone]  Fax: [phone]  PATIENT NAME: Kristi Woods DOB: [birth year] MRN: [id number]  PRIMARY CARE PROVIDER:   System, Provider Not In  REFERRING PROVIDER:  Rudell Cobb, NP [place],  [place] [address]  RESPONSIBLE PARTY:   Mother, Kristi Woods  I was asked to see Kristi Woods by Dr Aundria Rud for Palliative care consult for goals of care  Due to the COVID-19 crisis, this visit was done via telemedicine from my office and it was initiated and consent by this patient and or family.   RECOMMENDATIONS and PLAN:  1. ACP: Discussed code status; DNR and placed in Epic/Vynca; will mail blank most form for review;   Kristi Woods endorses wishes are to keep Kristi Woods at home with conservative interventions, including ventilator, antibiotics if necessary   2. Palliative care encounter; Palliative medicine team will continue to support patient, patient's family, and medical team. Visit consisted of counseling and education dealing with the complex and emotionally intense issues of symptom management and palliative care in the setting of serious and potentially life-threatening illness  I spent 65 minutes providing this consultation,  from 8:45am to 9:45am. More than 50% of the time in this consultation was spent coordinating communication.   HISTORY OF PRESENT ILLNESS:  Kristi Woods is a 19 y.o. 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 I called Kristi Woods, mother for scheduled initial palliative care telemedicine telephonic visit. We talked about purpose of palliative care visit and Kristi Woods in agreement. We talked about past medical history, chronic disease progression. We talked about initial gestation and birth with cord wrapped  around Kristi Woods's neck. We talked about the progression through early years through now currently 19 years old. Kristi Woods talks about being told on multiple occasions during hospitalizations that there may be a chance Kristi Woods may "not make it". We talked about transition to trach and ventilator dependent. We talked about electrolyte imbalances and feedings/nutrition. We talked about [name] and [name]. We talked about her daily routine. Kristi Woods endorses that they get Kristi Woods up everyday in the afternoon. Caregivers do turn the TV on. [name] does look at it though questionable of cognitive ability. Kristi Woods endorses that [name] does smile. We talked about transitioning from Pediatric to Adult specialist which is in the process. Saamiya's Primary Care is [facility] so she will be able to stay with that position. We talked about medical goals of care. We talked about code status. Kristi Woods indoor says they would not want Kristi Woods to have CPR. We talked about completion of a DNR form and Kristi Woods and agreement. Will place an Kristi Woods. Also talked about most form and we'll make a blank copy for Kristi Woods to review. We talked about role of palliative care and plan of care. Discuss follow-up palliative care appointment in 4 weeks if needed or sooner should she declined. Kristi Woods in agreement. Appointment schedule. Therapeutic listening and emotional support provided. Contact information provided. Questions answered the satisfaction. Palliative Care was asked to help address goals of care.   CODE STATUS: DNR  PPS: 30% HOSPICE ELIGIBILITY/DIAGNOSIS: TBD  PAST MEDICAL HISTORY: No past medical history on file.  SOCIAL HX:  Social History   Tobacco Use  . Smoking status: Not on file  Substance Use Topics  . Alcohol use: Not on file    ALLERGIES:  Not on File   PERTINENT MEDICATIONS:  No outpatient encounter medications on file as of 01/07/2020.   No facility-administered encounter medications on file as of 01/07/2020.     PHYSICAL EXAM:   deferred  Kristi Margulies Prince Rome, NP

## 2020-02-02 DIAGNOSIS — K6389 Other specified diseases of intestine: Secondary | ICD-10-CM | POA: Insufficient documentation

## 2020-02-02 DIAGNOSIS — K9189 Other postprocedural complications and disorders of digestive system: Secondary | ICD-10-CM | POA: Insufficient documentation

## 2020-02-06 ENCOUNTER — Telehealth: Payer: Self-pay | Admitting: Nurse Practitioner

## 2020-02-06 ENCOUNTER — Other Ambulatory Visit: Payer: Medicaid Other | Admitting: Nurse Practitioner

## 2020-02-06 ENCOUNTER — Other Ambulatory Visit: Payer: Self-pay

## 2020-02-06 NOTE — Telephone Encounter (Signed)
I called Kristi Woods mom for scheduled f/u telemedicine PC visit, no answer, message left with contact information

## 2020-02-18 ENCOUNTER — Telehealth: Payer: Self-pay | Admitting: Nurse Practitioner

## 2020-02-18 NOTE — Telephone Encounter (Signed)
I called Kristi Seta, Ms. Frith mother to reschedule f/u PC visit, no answer, message left with contact infomation

## 2020-03-03 ENCOUNTER — Ambulatory Visit: Payer: Medicaid Other | Attending: Critical Care Medicine

## 2020-03-03 ENCOUNTER — Encounter: Payer: Self-pay | Admitting: Critical Care Medicine

## 2020-03-03 DIAGNOSIS — R633 Feeding difficulties, unspecified: Secondary | ICD-10-CM | POA: Insufficient documentation

## 2020-03-03 DIAGNOSIS — E2740 Unspecified adrenocortical insufficiency: Secondary | ICD-10-CM | POA: Insufficient documentation

## 2020-03-03 DIAGNOSIS — F88 Other disorders of psychological development: Secondary | ICD-10-CM | POA: Insufficient documentation

## 2020-03-03 DIAGNOSIS — G809 Cerebral palsy, unspecified: Secondary | ICD-10-CM | POA: Insufficient documentation

## 2020-03-03 DIAGNOSIS — K219 Gastro-esophageal reflux disease without esophagitis: Secondary | ICD-10-CM | POA: Insufficient documentation

## 2020-03-03 DIAGNOSIS — K5909 Other constipation: Secondary | ICD-10-CM | POA: Insufficient documentation

## 2020-03-03 DIAGNOSIS — Z23 Encounter for immunization: Secondary | ICD-10-CM

## 2020-03-03 NOTE — Progress Notes (Signed)
   Covid-19 Vaccination Clinic  Name:  RILDA BULLS    MRN: [id number] DOB: [birth year]  03/03/2020  Ms. Bergman was observed post Covid-19 immunization for 15 minutes without incident. She was provided with Vaccine Information Sheet and instruction to access the V-Safe system.   Ms. Giraldo was instructed to call 911 with any severe reactions post vaccine: Marland Kitchen Difficulty breathing  . Swelling of face and throat  . A fast heartbeat  . A bad rash all over body  . Dizziness and weakness   Immunizations Administered    Name Date Dose VIS Date Route   Moderna COVID-19 Vaccine 03/03/2020  2:39 PM 0.5 mL 10/22/2019 Intramuscular   Manufacturer: Moderna   Lot: [id number]   NDC: 32419-914-44

## 2020-03-17 ENCOUNTER — Other Ambulatory Visit: Payer: Medicaid Other | Admitting: Nurse Practitioner

## 2020-03-17 ENCOUNTER — Other Ambulatory Visit: Payer: Self-pay

## 2020-03-17 ENCOUNTER — Encounter: Payer: Self-pay | Admitting: Nurse Practitioner

## 2020-03-17 DIAGNOSIS — G809 Cerebral palsy, unspecified: Secondary | ICD-10-CM

## 2020-03-17 DIAGNOSIS — Z515 Encounter for palliative care: Secondary | ICD-10-CM

## 2020-03-17 NOTE — Progress Notes (Signed)
    Therapist, nutritional Palliative Care Consult Note Telephone: [phone]  Fax: [phone]  PATIENT NAME: Kristi Woods DOB: May 18, 2001 MRN: [id number]  PRIMARY CARE PROVIDER:   Lynne Leader, NP REFERRING PROVIDER:  Lynne Leader, NP [address] [place],  [place] [address]  RESPONSIBLE PARTY:   Mother, Kristi Woods  Due to the COVID-19 crisis, this visit was done via telemedicine from my office and it was initiated and consent by this patient and or family.  RECOMMENDATIONS and PLAN: 1.ACP:  DNR  in Epic/Vynca; will mail blank most form for review;   Kristi Woods endorses wishes are to keep Kristi Woods at home with conservative interventions, including ventilator, antibiotics if necessary  2.Palliative care encounter; Palliative medicine team will continue to support patient, patient's family, and medical team. Visit consisted of counseling and education dealing with the complex and emotionally intense issues of symptom management and palliative care in the setting of serious and potentially life-threatening illness  I spent 47 minutes providing this consultation,  from 8:30am to 9:17am. More than 50% of the time in this consultation was spent coordinating communication.   HISTORY OF PRESENT ILLNESS:  Kristi Woods is a 19 y.o. 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 I called Kristi Woods mother for schedule palliative care telemedicine telephonic is video not available follow-up appointment. Kristi Woods and I talked about purpose of palliative visit and in agreement. We talked about how Kristi Woods has been doing. Kristi Woods endorses Kristi Woods has been doing fine. No recent hospitalizations, falls, infections, wounds. We talked about recent change out of J-tube without any difficulty. We briefly talked  about transition to adult physicians and no concerns or changes. Kristi Woods endorses trying to use birth control patch to control her periods does not seem to be working. Primary provider wants her to see a GYN for follow up. No noted weight loss. No change in routine schedule. We talked about medical goals of care. We talked about palliative care visit in two months if needed or sooner since she declined. Offered telephone or in person. Kristi Woods in agreement to in a person visit, scheduled. Therapeutic listening and emotional support provided. Contact information. Questions answered to satisfaction. Palliative Care was asked to help address goals of care.   CODE STATUS: DNR  PPS: 30% HOSPICE ELIGIBILITY/DIAGNOSIS: TBD  PAST MEDICAL HISTORY:  Past Medical History:  Diagnosis Date  . Cerebral palsy (HCC)     SOCIAL HX:  Social History   Tobacco Use  . Smoking status: Not on file  Substance Use Topics  . Alcohol use: Not on file    ALLERGIES: No Known Allergies   PERTINENT MEDICATIONS:  No outpatient encounter medications on file as of 03/17/2020.   No facility-administered encounter medications on file as of 03/17/2020.    PHYSICAL EXAM:   Deferred  Kristi Woods Z Kristi Luviano, NP

## 2020-03-30 ENCOUNTER — Ambulatory Visit: Payer: Medicaid Other | Attending: Critical Care Medicine

## 2020-03-30 DIAGNOSIS — Z23 Encounter for immunization: Secondary | ICD-10-CM

## 2020-03-30 NOTE — Progress Notes (Signed)
   Covid-19 Vaccination Clinic  Name:  TALAYSIA PINHEIRO    MRN: [id number] DOB: 08/10/2001  03/30/2020  Ms. Ellinwood was observed post Covid-19 immunization for 15 minutes without incident. She was provided with Vaccine Information Sheet and instruction to access the V-Safe system.   Ms. Gillham was instructed to call 911 with any severe reactions post vaccine: Marland Kitchen Difficulty breathing  . Swelling of face and throat  . A fast heartbeat  . A bad rash all over body  . Dizziness and weakness   Immunizations Administered    Name Date Dose VIS Date Route   Moderna COVID-19 Vaccine 03/30/2020  9:34 AM 0.5 mL 10/2019 Intramuscular   Manufacturer: Moderna   Lot: [id number]   NDC: 67703-403-52

## 2020-04-09 ENCOUNTER — Other Ambulatory Visit
Admission: RE | Admit: 2020-04-09 | Discharge: 2020-04-09 | Disposition: A | Discharge status: Home / Self Care | Payer: Medicaid Other | Source: Ambulatory Visit | Attending: Internal Medicine | Admitting: Internal Medicine

## 2020-04-09 DIAGNOSIS — Z930 Tracheostomy status: Secondary | ICD-10-CM | POA: Insufficient documentation

## 2020-04-09 DIAGNOSIS — J9612 Chronic respiratory failure with hypercapnia: Secondary | ICD-10-CM | POA: Diagnosis not present

## 2020-04-09 DIAGNOSIS — Z9911 Dependence on respirator [ventilator] status: Secondary | ICD-10-CM | POA: Insufficient documentation

## 2020-04-09 DIAGNOSIS — J9621 Acute and chronic respiratory failure with hypoxia: Secondary | ICD-10-CM | POA: Diagnosis present

## 2020-04-09 DIAGNOSIS — J9611 Chronic respiratory failure with hypoxia: Secondary | ICD-10-CM | POA: Insufficient documentation

## 2020-04-09 DIAGNOSIS — J9622 Acute and chronic respiratory failure with hypercapnia: Secondary | ICD-10-CM | POA: Insufficient documentation

## 2020-04-09 LAB — BASIC METABOLIC PANEL
Anion gap: 15 (ref 5–15)
BUN: 45 mg/dL — ABNORMAL HIGH (ref 6–20)
CO2: 28 mmol/L (ref 22–32)
Calcium: 9.5 mg/dL (ref 8.9–10.3)
Chloride: 95 mmol/L — ABNORMAL LOW (ref 98–111)
Creatinine, Ser: 0.6 mg/dL (ref 0.44–1.00)
GFR calc Af Amer: >60 mL/min (ref >60)
GFR calc non Af Amer: >60 mL/min (ref >60)
Glucose, Bld: 63 mg/dL — ABNORMAL LOW (ref 70–99)
Potassium: 6.2 mmol/L — ABNORMAL HIGH (ref 3.5–5.1)
Sodium: 138 mmol/L (ref 135–145)

## 2020-04-15 ENCOUNTER — Other Ambulatory Visit
Admission: RE | Admit: 2020-04-15 | Discharge: 2020-04-15 | Disposition: A | Discharge status: Home / Self Care | Payer: Medicaid Other | Source: Ambulatory Visit | Attending: Internal Medicine | Admitting: Internal Medicine

## 2020-04-15 DIAGNOSIS — E878 Other disorders of electrolyte and fluid balance, not elsewhere classified: Secondary | ICD-10-CM | POA: Diagnosis not present

## 2020-04-15 LAB — BASIC METABOLIC PANEL
Anion gap: 15 (ref 5–15)
BUN: 37 mg/dL — ABNORMAL HIGH (ref 6–20)
CO2: 33 mmol/L — ABNORMAL HIGH (ref 22–32)
Calcium: 9.6 mg/dL (ref 8.9–10.3)
Chloride: 91 mmol/L — ABNORMAL LOW (ref 98–111)
Creatinine, Ser: 0.59 mg/dL (ref 0.44–1.00)
GFR calc Af Amer: >60 mL/min (ref >60)
GFR calc non Af Amer: >60 mL/min (ref >60)
Glucose, Bld: 90 mg/dL (ref 70–99)
Potassium: 3.8 mmol/L (ref 3.5–5.1)
Sodium: 139 mmol/L (ref 135–145)

## 2020-04-22 ENCOUNTER — Other Ambulatory Visit
Admission: RE | Admit: 2020-04-22 | Discharge: 2020-04-22 | Disposition: A | Discharge status: Home / Self Care | Payer: Medicaid Other | Source: Ambulatory Visit | Attending: Pediatrics | Admitting: Pediatrics

## 2020-04-22 DIAGNOSIS — Z7189 Other specified counseling: Secondary | ICD-10-CM | POA: Diagnosis not present

## 2020-04-22 DIAGNOSIS — E878 Other disorders of electrolyte and fluid balance, not elsewhere classified: Secondary | ICD-10-CM | POA: Insufficient documentation

## 2020-04-22 LAB — BASIC METABOLIC PANEL
Anion gap: 15 (ref 5–15)
BUN: 40 mg/dL — ABNORMAL HIGH (ref 6–20)
CO2: 33 mmol/L — ABNORMAL HIGH (ref 22–32)
Calcium: 9.6 mg/dL (ref 8.9–10.3)
Chloride: 91 mmol/L — ABNORMAL LOW (ref 98–111)
Creatinine, Ser: 0.64 mg/dL (ref 0.44–1.00)
GFR calc Af Amer: >60 mL/min (ref >60)
GFR calc non Af Amer: >60 mL/min (ref >60)
Glucose, Bld: 126 mg/dL — ABNORMAL HIGH (ref 70–99)
Potassium: 4 mmol/L (ref 3.5–5.1)
Sodium: 139 mmol/L (ref 135–145)

## 2020-05-06 ENCOUNTER — Other Ambulatory Visit
Admission: RE | Admit: 2020-05-06 | Discharge: 2020-05-06 | Disposition: A | Discharge status: Home / Self Care | Payer: Medicaid Other | Source: Ambulatory Visit | Attending: Pediatrics | Admitting: Pediatrics

## 2020-05-06 DIAGNOSIS — Z7189 Other specified counseling: Secondary | ICD-10-CM | POA: Diagnosis not present

## 2020-05-06 DIAGNOSIS — E878 Other disorders of electrolyte and fluid balance, not elsewhere classified: Secondary | ICD-10-CM | POA: Insufficient documentation

## 2020-05-06 LAB — BASIC METABOLIC PANEL
Anion gap: 12 (ref 5–15)
BUN: 43 mg/dL — ABNORMAL HIGH (ref 6–20)
CO2: 36 mmol/L — ABNORMAL HIGH (ref 22–32)
Calcium: 9.3 mg/dL (ref 8.9–10.3)
Chloride: 91 mmol/L — ABNORMAL LOW (ref 98–111)
Creatinine, Ser: 0.65 mg/dL (ref 0.44–1.00)
GFR calc Af Amer: >60 mL/min (ref >60)
GFR calc non Af Amer: >60 mL/min (ref >60)
Glucose, Bld: 134 mg/dL — ABNORMAL HIGH (ref 70–99)
Potassium: 3.6 mmol/L (ref 3.5–5.1)
Sodium: 139 mmol/L (ref 135–145)

## 2020-05-07 ENCOUNTER — Telehealth: Payer: Self-pay

## 2020-05-07 NOTE — Telephone Encounter (Signed)
Spoke with Esaw Grandchild at [facility] [phone].  She gave update on patient and recent conversation with mother, [name].  Will send message to NP Gusler to follow up with mother to determine how we can best support them and I they need sooner visit than schedule 05/26/20 visit.

## 2020-05-11 ENCOUNTER — Telehealth: Payer: Self-pay | Admitting: Nurse Practitioner

## 2020-05-11 NOTE — Telephone Encounter (Signed)
I received a message to contact [facility] Kristi Woods at [phone]). I called Kristi Woods, updated with recent hospitalization. I called Kristi Woods, Kristi Woods mother no answer, message left with contact information to return call to further discuss goc and possible hospice services.

## 2020-05-11 NOTE — Telephone Encounter (Signed)
I called [name], Ms. Ashworth mother. We talked about recent hospitalization and interventions including Lasix for diuresing for fluid overload. We talked about medical goals of care. We talked about quality of life and suffering what that looks like to her. We talked about comfort care. We talked about Hospice Services. [name] talked at length about being told from the time this might as well as a infant that her life expectancy was stored and they got her to 19 years. [name]  she feels like they were at a point where they are not quite ready to stop the labs and hospitalizations, diuresing. We talked about different clinical scenarios. We talked about Lasix and the effect on the kidneys. [name] endorses that during hospitalization miss my eyes after diuresing became content, happy and smiling. We talked about sustainability with cycle of fluid overload versus diuresing. We talked about dialysis. We talked about challenges that they have gone through through the medical system and treatment options. We talked about wishes that present time are to continue weekly labs, hospitalization when necessary for diuresing a rehydration. We talked about continuing team approach for care. We talked about something management and what that would look like when the decision is made to be strictly comfort care including medication management for shortness of breath, fluid overload. We talked about role of palliative care in plan of care. We talked about upcoming palliative appointment July 6th, 2021 will continue to keep. We talked about if something urgent arises prior to appointment can call and schedule earlier follow up or further discuss by phone. [name] and agreement. Contact information. Questions answered to satisfaction. Therapeutic listening and emotional support provided. Total time spent 25 minutes Documentation 5 minutes Phone discussion 20 minutes

## 2020-05-13 ENCOUNTER — Other Ambulatory Visit
Admission: RE | Admit: 2020-05-13 | Discharge: 2020-05-13 | Disposition: A | Discharge status: Home / Self Care | Payer: Medicaid Other | Source: Ambulatory Visit | Attending: Pediatrics | Admitting: Pediatrics

## 2020-05-13 DIAGNOSIS — E878 Other disorders of electrolyte and fluid balance, not elsewhere classified: Secondary | ICD-10-CM | POA: Diagnosis not present

## 2020-05-13 DIAGNOSIS — J9612 Chronic respiratory failure with hypercapnia: Secondary | ICD-10-CM | POA: Diagnosis not present

## 2020-05-13 DIAGNOSIS — Z7189 Other specified counseling: Secondary | ICD-10-CM | POA: Diagnosis not present

## 2020-05-13 DIAGNOSIS — J9611 Chronic respiratory failure with hypoxia: Secondary | ICD-10-CM | POA: Diagnosis not present

## 2020-05-13 LAB — BASIC METABOLIC PANEL
Anion gap: 13 (ref 5–15)
BUN: 46 mg/dL — ABNORMAL HIGH (ref 6–20)
CO2: 35 mmol/L — ABNORMAL HIGH (ref 22–32)
Calcium: 9.3 mg/dL (ref 8.9–10.3)
Chloride: 91 mmol/L — ABNORMAL LOW (ref 98–111)
Creatinine, Ser: 0.62 mg/dL (ref 0.44–1.00)
GFR calc Af Amer: >60 mL/min (ref >60)
GFR calc non Af Amer: >60 mL/min (ref >60)
Glucose, Bld: 85 mg/dL (ref 70–99)
Potassium: 4.5 mmol/L (ref 3.5–5.1)
Sodium: 139 mmol/L (ref 135–145)

## 2020-05-13 LAB — ALBUMIN: Albumin: 2.8 g/dL — ABNORMAL LOW (ref 3.5–5.0)

## 2020-05-20 ENCOUNTER — Other Ambulatory Visit
Admission: RE | Admit: 2020-05-20 | Discharge: 2020-05-20 | Disposition: A | Discharge status: Home / Self Care | Payer: Medicaid Other | Source: Ambulatory Visit | Attending: Pediatrics | Admitting: Pediatrics

## 2020-05-20 DIAGNOSIS — Z7189 Other specified counseling: Secondary | ICD-10-CM | POA: Diagnosis present

## 2020-05-20 DIAGNOSIS — E878 Other disorders of electrolyte and fluid balance, not elsewhere classified: Secondary | ICD-10-CM | POA: Insufficient documentation

## 2020-05-20 LAB — BASIC METABOLIC PANEL
Anion gap: 18 — ABNORMAL HIGH (ref 5–15)
BUN: 49 mg/dL — ABNORMAL HIGH (ref 6–20)
CO2: 37 mmol/L — ABNORMAL HIGH (ref 22–32)
Calcium: 9.4 mg/dL (ref 8.9–10.3)
Chloride: 83 mmol/L — ABNORMAL LOW (ref 98–111)
Creatinine, Ser: 0.61 mg/dL (ref 0.44–1.00)
GFR calc Af Amer: >60 mL/min (ref >60)
GFR calc non Af Amer: >60 mL/min (ref >60)
Glucose, Bld: 123 mg/dL — ABNORMAL HIGH (ref 70–99)
Potassium: 3.2 mmol/L — ABNORMAL LOW (ref 3.5–5.1)
Sodium: 138 mmol/L (ref 135–145)

## 2020-05-26 ENCOUNTER — Encounter: Payer: Self-pay | Admitting: Nurse Practitioner

## 2020-05-26 ENCOUNTER — Other Ambulatory Visit: Payer: Medicaid Other | Admitting: Nurse Practitioner

## 2020-05-26 ENCOUNTER — Other Ambulatory Visit: Payer: Self-pay

## 2020-05-26 DIAGNOSIS — Z515 Encounter for palliative care: Secondary | ICD-10-CM

## 2020-05-26 DIAGNOSIS — G809 Cerebral palsy, unspecified: Secondary | ICD-10-CM

## 2020-05-26 NOTE — Progress Notes (Signed)
Therapist, nutritional Palliative Care Consult Note Telephone: [phone]  Fax: [phone]  PATIENT NAME: Kristi Woods DOB: Mar 03, 2001 MRN: [id number] PRIMARY CARE PROVIDER:   Lynne Leader, NP REFERRING PROVIDER:Krohl, Louanne Skye, NP [address] [place], [place] [address]  RESPONSIBLE PARTY:Mother, Kristi Woods  Due to the COVID-19 crisis, this visit was done via telemedicine from my office and it was initiated and consent by this patient and or family.  RECOMMENDATIONS and PLAN: 1.ACP: DNR  in Epic/Vynca  Kristi Woods endorses wishes are to keep Kristi Woods at home with conservative interventions, including ventilator, antibiotics if necessary  2. Pain; agree with recommendation to start Cymbalta. Continue to monitor symptoms, repositioning in chair; alternative methods pain modality.   3.Palliative care encounter; Palliative medicine team will continue to support patient, patient's family, and medical team. Visit consisted of counseling and education dealing with the complex and emotionally intense issues of symptom management and palliative care in the setting of serious and potentially life-threatening illness  I spent 50 minutes providing this consultation,  from 8:30am to 9:20am. More than 50% of the time in this consultation was spent coordinating communication.   HISTORY OF PRESENT ILLNESS:  WADIE MATTIE is a 19 y.o. 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 I called Kristi Woods, [place] mother, discuss palliative care visit in agreement. We talked about how Kristi Woods has been doing. Kristi Woods endorses she seems to be doing a little bit better. We talked about addition of spironolactone which is help some of the edema. O2 SATs  have been improved and able to wean O2 down to 2 L at times. We talked about Lasix. We talked about chronic kidney disease and the influence of diuretics. We talked about hydration, feedings through her j-tube essential to sustain life. Kristi Woods endorses no new changes or problems with her feedings, residuals. We talked about updated Chronic Care Management last meeting discuss goals of care. Kristi Woods endorses wishes are to treat what is treatable, hospitalizations if needed, continue weekly Labs as her sodium has been trending on the low side. We talked about medical goals of care, comfort care. Kristi Woods endorses they continue to want to treat with minimizing suffering. Kristi Woods endorses she felt like they were heard and that the plan has been verified and came together. We talked about symptoms of pain. Kristi Woods endorses that it appears Kristi Woods is uncomfortable in her wheelchair for what she did bring this up to Chronic Care team. Recommendation was to initiate Cymbalta. Kristi Woods and I did talk about Cymbalta the effects benefits. Discuss that that would be a good option to help pain but in addition we talked about depression. Kristi Woods endorse is she was going to  let the chronic care team know on Thursday's meeting they want to proceed with initiation of Cymbalta. No recent Falls, wounds, hospitalizations, infections. Wishes are to continue caring for Kristi Woods at home. We talked about role of palliative care and plan of care. Therapeutic listening and emotional support provided. Contact information. Questions answered to satisfaction. Discuss will follow up in two months if needed, sooner if Kristi Woods did fine.  Palliative Care was asked to help to continue to address goals of care.   CODE STATUS: DNR  PPS: 30% HOSPICE ELIGIBILITY/DIAGNOSIS: TBD  PAST MEDICAL HISTORY:  Past Medical History:  Diagnosis Date  . Cerebral palsy (HCC)  SOCIAL HX:  Social History   Tobacco Use  . Smoking status: Not on file    Substance Use Topics  . Alcohol use: Not on file    ALLERGIES: No Known Allergies   PERTINENT MEDICATIONS:  No outpatient encounter medications on file as of 05/26/2020.   No facility-administered encounter medications on file as of 05/26/2020.    PHYSICAL EXAM:   Deferred Kristi Lange Z Amare Bail, NP

## 2020-05-27 ENCOUNTER — Other Ambulatory Visit
Admission: RE | Admit: 2020-05-27 | Discharge: 2020-05-27 | Disposition: A | Discharge status: Home / Self Care | Payer: Medicaid Other | Source: Ambulatory Visit | Attending: Pediatrics | Admitting: Pediatrics

## 2020-05-27 DIAGNOSIS — Z7189 Other specified counseling: Secondary | ICD-10-CM | POA: Insufficient documentation

## 2020-05-27 DIAGNOSIS — E878 Other disorders of electrolyte and fluid balance, not elsewhere classified: Secondary | ICD-10-CM | POA: Diagnosis not present

## 2020-05-27 LAB — BASIC METABOLIC PANEL
Anion gap: 20 — ABNORMAL HIGH (ref 5–15)
BUN: 45 mg/dL — ABNORMAL HIGH (ref 6–20)
CO2: 34 mmol/L — ABNORMAL HIGH (ref 22–32)
Calcium: 9.5 mg/dL (ref 8.9–10.3)
Chloride: 87 mmol/L — ABNORMAL LOW (ref 98–111)
Creatinine, Ser: 0.65 mg/dL (ref 0.44–1.00)
GFR calc Af Amer: >60 mL/min (ref >60)
GFR calc non Af Amer: >60 mL/min (ref >60)
Glucose, Bld: 124 mg/dL — ABNORMAL HIGH (ref 70–99)
Potassium: 3.8 mmol/L (ref 3.5–5.1)
Sodium: 141 mmol/L (ref 135–145)

## 2020-06-03 ENCOUNTER — Other Ambulatory Visit
Admission: RE | Admit: 2020-06-03 | Discharge: 2020-06-03 | Disposition: A | Discharge status: Home / Self Care | Payer: Medicaid Other | Source: Ambulatory Visit | Attending: Pediatrics | Admitting: Pediatrics

## 2020-06-03 DIAGNOSIS — Z7189 Other specified counseling: Secondary | ICD-10-CM | POA: Diagnosis present

## 2020-06-03 DIAGNOSIS — E878 Other disorders of electrolyte and fluid balance, not elsewhere classified: Secondary | ICD-10-CM | POA: Insufficient documentation

## 2020-06-03 LAB — BASIC METABOLIC PANEL
Anion gap: 14 (ref 5–15)
BUN: 36 mg/dL — ABNORMAL HIGH (ref 6–20)
CO2: 35 mmol/L — ABNORMAL HIGH (ref 22–32)
Calcium: 9.7 mg/dL (ref 8.9–10.3)
Chloride: 89 mmol/L — ABNORMAL LOW (ref 98–111)
Creatinine, Ser: 0.6 mg/dL (ref 0.44–1.00)
GFR calc Af Amer: >60 mL/min (ref >60)
GFR calc non Af Amer: >60 mL/min (ref >60)
Glucose, Bld: 117 mg/dL — ABNORMAL HIGH (ref 70–99)
Potassium: 3.8 mmol/L (ref 3.5–5.1)
Sodium: 138 mmol/L (ref 135–145)

## 2020-06-17 ENCOUNTER — Other Ambulatory Visit
Admission: RE | Admit: 2020-06-17 | Discharge: 2020-06-17 | Disposition: A | Discharge status: Home / Self Care | Payer: Medicaid Other | Source: Ambulatory Visit | Attending: Pediatrics | Admitting: Pediatrics

## 2020-06-17 DIAGNOSIS — E878 Other disorders of electrolyte and fluid balance, not elsewhere classified: Secondary | ICD-10-CM | POA: Diagnosis present

## 2020-06-17 DIAGNOSIS — Z7189 Other specified counseling: Secondary | ICD-10-CM | POA: Insufficient documentation

## 2020-06-17 LAB — BASIC METABOLIC PANEL
Anion gap: 10 (ref 5–15)
BUN: 33 mg/dL — ABNORMAL HIGH (ref 6–20)
CO2: 37 mmol/L — ABNORMAL HIGH (ref 22–32)
Calcium: 9.5 mg/dL (ref 8.9–10.3)
Chloride: 89 mmol/L — ABNORMAL LOW (ref 98–111)
Creatinine, Ser: 0.49 mg/dL (ref 0.44–1.00)
GFR calc Af Amer: >60 mL/min (ref >60)
GFR calc non Af Amer: >60 mL/min (ref >60)
Glucose, Bld: 105 mg/dL — ABNORMAL HIGH (ref 70–99)
Potassium: 3.6 mmol/L (ref 3.5–5.1)
Sodium: 136 mmol/L (ref 135–145)

## 2020-06-24 ENCOUNTER — Other Ambulatory Visit
Admission: RE | Admit: 2020-06-24 | Discharge: 2020-06-24 | Disposition: A | Discharge status: Home / Self Care | Payer: Medicaid Other | Source: Ambulatory Visit | Attending: Pediatrics | Admitting: Pediatrics

## 2020-06-24 DIAGNOSIS — E878 Other disorders of electrolyte and fluid balance, not elsewhere classified: Secondary | ICD-10-CM | POA: Insufficient documentation

## 2020-06-24 DIAGNOSIS — Z7189 Other specified counseling: Secondary | ICD-10-CM | POA: Insufficient documentation

## 2020-06-24 LAB — BASIC METABOLIC PANEL
Anion gap: 14 (ref 5–15)
BUN: 39 mg/dL — ABNORMAL HIGH (ref 6–20)
CO2: 35 mmol/L — ABNORMAL HIGH (ref 22–32)
Calcium: 9.5 mg/dL (ref 8.9–10.3)
Chloride: 89 mmol/L — ABNORMAL LOW (ref 98–111)
Creatinine, Ser: 0.48 mg/dL (ref 0.44–1.00)
GFR calc Af Amer: >60 mL/min (ref >60)
GFR calc non Af Amer: >60 mL/min (ref >60)
Glucose, Bld: 130 mg/dL — ABNORMAL HIGH (ref 70–99)
Potassium: 3.9 mmol/L (ref 3.5–5.1)
Sodium: 138 mmol/L (ref 135–145)

## 2020-07-08 ENCOUNTER — Other Ambulatory Visit
Admission: RE | Admit: 2020-07-08 | Discharge: 2020-07-08 | Disposition: A | Discharge status: Home / Self Care | Payer: Medicaid Other | Source: Ambulatory Visit | Attending: Pediatrics | Admitting: Pediatrics

## 2020-07-08 DIAGNOSIS — E878 Other disorders of electrolyte and fluid balance, not elsewhere classified: Secondary | ICD-10-CM | POA: Diagnosis present

## 2020-07-08 DIAGNOSIS — Z7189 Other specified counseling: Secondary | ICD-10-CM | POA: Diagnosis not present

## 2020-07-08 LAB — BASIC METABOLIC PANEL
Anion gap: 13 (ref 5–15)
BUN: 33 mg/dL — ABNORMAL HIGH (ref 6–20)
CO2: 36 mmol/L — ABNORMAL HIGH (ref 22–32)
Calcium: 9.5 mg/dL (ref 8.9–10.3)
Chloride: 91 mmol/L — ABNORMAL LOW (ref 98–111)
Creatinine, Ser: <0.3 mg/dL — ABNORMAL LOW (ref 0.44–1.00)
Glucose, Bld: 94 mg/dL (ref 70–99)
Potassium: 3.9 mmol/L (ref 3.5–5.1)
Sodium: 140 mmol/L (ref 135–145)

## 2020-07-14 ENCOUNTER — Other Ambulatory Visit: Payer: Medicaid Other | Admitting: Nurse Practitioner

## 2020-07-14 ENCOUNTER — Other Ambulatory Visit: Payer: Self-pay

## 2020-07-14 ENCOUNTER — Encounter: Payer: Self-pay | Admitting: Nurse Practitioner

## 2020-07-14 DIAGNOSIS — G809 Cerebral palsy, unspecified: Secondary | ICD-10-CM

## 2020-07-14 DIAGNOSIS — Z515 Encounter for palliative care: Secondary | ICD-10-CM

## 2020-07-14 NOTE — Progress Notes (Addendum)
Therapist, nutritional Palliative Care Consult Note Telephone: [phone]  Fax: [phone]  PATIENT NAME: Kristi Woods DOB: 11-Nov-2001 MRN: [id number]  PRIMARY CARE PROVIDER:Krohl, Louanne Skye, NP REFERRING PROVIDER:Krohl, Louanne Skye, NP [address] [place], [place] [address]  RESPONSIBLE PARTY:Mother, [name] Laswell   RECOMMENDATIONS and PLAN: 1.ACP:DNR in Epic/Vynca  Kristi Woods endorses wishes are to keep Kristi Woods at home with conservative interventions, including ventilator, antibiotics if necessary  2. Pain; agree with recommendation to start Effexor. Continue to monitor symptoms, repositioning in chair; alternative methods pain modality.   3.Palliative care encounter; Palliative medicine team will continue to support patient, patient's family, and medical team. Visit consisted of counseling and education dealing with the complex and emotionally intense issues of symptom management and palliative care in the setting of serious and potentially life-threatening illness  4. F/u 2 months with home ventilator, chronically ill, total dependent, ongoing monitoring with progressive changes for goc  I spent 60 minutes providing this consultation,  from 8:45am to 9:45am. More than 50% of the time in this consultation was spent coordinating communication.   HISTORY OF PRESENT ILLNESS:  Kristi Woods is a 19 y.o. 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 In-person palliative care follow-up visit for Kristi Woods. [name]. Kristi Woods mother was present at the Palliative Care visit in agreement. We talked about how Sherisse has been doing. Kristi Woods endorsed is no recent hospitalizations, wounds, infections, falls. At present Kristi Woods currently lying in her hospital bed in her room, appears functionally  debilitated but comfortable. Kristi Woods is total care non-ambulatory, requires total bathing dressing and in comments. We talked about bowel regiment. Kristi Woods endorses that is always been a challenge. We talked about mobility as they do get Kristi Woods up everyday. We talked about her tube feeding which seemed to be doing well no residuals noted. Kristi Woods endorses they have continued with regular lives with recent lab results improved sodium and chloride. We talked about Kristi Woods daily routine. We talked about medical goals of care training what is treatable continue and hospitalizations as needed. We talked about quality of life. We talked about Kristi. Felber as an infant that Kristi Woods took her everywhere she went. A few years ago with the trach it became more difficult and with her adult size. We talked about quality of life. We talked about was suffering looks like. We talked about caregivers that come in to quality of life. We talked about was suffering looks like. We talked about nurses that do come in to help a certain amount of hours a day. We talked about caregiver stress and fatigue. We talked about chronic disease progression including high doses of Lasix with impact on the kidneys and the importance of routine lab work. We talked about the edema and her feet and the pattern. We talked about her O2 saturation that typically are in the 80s and 90s. Kristi Woods endorses the Physicians want them above 88% but at times they are 86. Kristi Woods appeared comfortable, ventilating without difficulty. We talked about monitoring numbers and monitoring clinical symptoms. We talked about role of palliative care plan of care. We talked about ultimate goal of minimizing suffering an improving comfort. Kristi Woods endorses they did start effexor and she seems to be doing better in her chair when she sitting up as where she was only to sit up two hours now it  is closer to four hours without appearing uncomfortable. [name] endorses  Kristi Woods  seems to be smiling more. During assessed did smile on occasion open her eyes, appears comfortable. We talked about follow-up palliative care visit in 2 months if needed or sooner should Kristi Woods. [name] in agreement an appointment scheduled. Contact information. Questions answer to satisfaction.  Palliative Care was asked to help to continue to address goals of care.   CODE STATUS: DNR  PPS: 30% HOSPICE ELIGIBILITY/DIAGNOSIS: TBD  PAST MEDICAL HISTORY:  Past Medical History:  Diagnosis Date  . Cerebral palsy (HCC)     SOCIAL HX:  Social History   Tobacco Use  . Smoking status: Not on file  Substance Use Topics  . Alcohol use: Not on file    ALLERGIES: No Known Allergies   PERTINENT MEDICATIONS:  No outpatient encounter medications on file as of 07/14/2020.   No facility-administered encounter medications on file as of 07/14/2020.    PHYSICAL EXAM:   General: NAD, debilitated, chronically ill, female Cardiovascular: regular rate and rhythm Pulmonary: clear ant fields Abdomen: soft, nontender, + bowel sounds/+button Extremities: +BLE edema Neurological: functionally quadriplegic  Kristi Petrucci Prince Rome, NP

## 2020-07-15 ENCOUNTER — Other Ambulatory Visit
Admission: RE | Admit: 2020-07-15 | Discharge: 2020-07-15 | Disposition: A | Discharge status: Home / Self Care | Payer: Medicaid Other | Source: Ambulatory Visit | Attending: Pediatrics | Admitting: Pediatrics

## 2020-07-15 DIAGNOSIS — E878 Other disorders of electrolyte and fluid balance, not elsewhere classified: Secondary | ICD-10-CM | POA: Insufficient documentation

## 2020-07-15 DIAGNOSIS — Z7189 Other specified counseling: Secondary | ICD-10-CM | POA: Insufficient documentation

## 2020-07-15 LAB — BASIC METABOLIC PANEL
Anion gap: 12 (ref 5–15)
BUN: 34 mg/dL — ABNORMAL HIGH (ref 6–20)
CO2: 37 mmol/L — ABNORMAL HIGH (ref 22–32)
Calcium: 9.6 mg/dL (ref 8.9–10.3)
Chloride: 87 mmol/L — ABNORMAL LOW (ref 98–111)
Creatinine, Ser: 0.47 mg/dL (ref 0.44–1.00)
GFR calc Af Amer: >60 mL/min (ref >60)
GFR calc non Af Amer: >60 mL/min (ref >60)
Glucose, Bld: 124 mg/dL — ABNORMAL HIGH (ref 70–99)
Potassium: 4.2 mmol/L (ref 3.5–5.1)
Sodium: 136 mmol/L (ref 135–145)

## 2020-07-22 ENCOUNTER — Other Ambulatory Visit
Admission: RE | Admit: 2020-07-22 | Discharge: 2020-07-22 | Disposition: A | Discharge status: Home / Self Care | Payer: Medicaid Other | Source: Ambulatory Visit | Attending: Pediatrics | Admitting: Pediatrics

## 2020-07-22 DIAGNOSIS — Z7189 Other specified counseling: Secondary | ICD-10-CM | POA: Diagnosis present

## 2020-07-22 DIAGNOSIS — E878 Other disorders of electrolyte and fluid balance, not elsewhere classified: Secondary | ICD-10-CM | POA: Diagnosis present

## 2020-07-22 LAB — BASIC METABOLIC PANEL
Anion gap: 11 (ref 5–15)
BUN: 37 mg/dL — ABNORMAL HIGH (ref 6–20)
CO2: 39 mmol/L — ABNORMAL HIGH (ref 22–32)
Calcium: 9.3 mg/dL (ref 8.9–10.3)
Chloride: 87 mmol/L — ABNORMAL LOW (ref 98–111)
Creatinine, Ser: 0.49 mg/dL (ref 0.44–1.00)
GFR calc Af Amer: >60 mL/min (ref >60)
GFR calc non Af Amer: >60 mL/min (ref >60)
Glucose, Bld: 122 mg/dL — ABNORMAL HIGH (ref 70–99)
Potassium: 3.9 mmol/L (ref 3.5–5.1)
Sodium: 137 mmol/L (ref 135–145)

## 2020-07-29 ENCOUNTER — Other Ambulatory Visit
Admission: RE | Admit: 2020-07-29 | Discharge: 2020-07-29 | Disposition: A | Discharge status: Home / Self Care | Payer: Medicaid Other | Source: Ambulatory Visit | Attending: Pediatrics | Admitting: Pediatrics

## 2020-07-29 DIAGNOSIS — J9612 Chronic respiratory failure with hypercapnia: Secondary | ICD-10-CM | POA: Diagnosis not present

## 2020-07-29 DIAGNOSIS — R633 Feeding difficulties: Secondary | ICD-10-CM | POA: Diagnosis present

## 2020-07-29 DIAGNOSIS — J9611 Chronic respiratory failure with hypoxia: Secondary | ICD-10-CM | POA: Insufficient documentation

## 2020-07-29 LAB — BASIC METABOLIC PANEL
Anion gap: 14 (ref 5–15)
BUN: 40 mg/dL — ABNORMAL HIGH (ref 6–20)
CO2: 36 mmol/L — ABNORMAL HIGH (ref 22–32)
Calcium: 9.2 mg/dL (ref 8.9–10.3)
Chloride: 86 mmol/L — ABNORMAL LOW (ref 98–111)
Creatinine, Ser: 0.52 mg/dL (ref 0.44–1.00)
GFR calc Af Amer: >60 mL/min (ref >60)
GFR calc non Af Amer: >60 mL/min (ref >60)
Glucose, Bld: 88 mg/dL (ref 70–99)
Potassium: 3.8 mmol/L (ref 3.5–5.1)
Sodium: 136 mmol/L (ref 135–145)

## 2020-08-05 ENCOUNTER — Other Ambulatory Visit
Admission: RE | Admit: 2020-08-05 | Discharge: 2020-08-05 | Disposition: A | Discharge status: Home / Self Care | Payer: Medicaid Other | Source: Ambulatory Visit | Attending: Physician Assistant | Admitting: Physician Assistant

## 2020-08-05 DIAGNOSIS — J9612 Chronic respiratory failure with hypercapnia: Secondary | ICD-10-CM | POA: Insufficient documentation

## 2020-08-05 DIAGNOSIS — R633 Feeding difficulties: Secondary | ICD-10-CM | POA: Insufficient documentation

## 2020-08-05 DIAGNOSIS — J9611 Chronic respiratory failure with hypoxia: Secondary | ICD-10-CM | POA: Diagnosis not present

## 2020-08-05 LAB — BASIC METABOLIC PANEL
Anion gap: 13 (ref 5–15)
BUN: 36 mg/dL — ABNORMAL HIGH (ref 6–20)
CO2: 35 mmol/L — ABNORMAL HIGH (ref 22–32)
Calcium: 9.1 mg/dL (ref 8.9–10.3)
Chloride: 87 mmol/L — ABNORMAL LOW (ref 98–111)
Creatinine, Ser: 0.44 mg/dL (ref 0.44–1.00)
GFR calc Af Amer: >60 mL/min (ref >60)
GFR calc non Af Amer: >60 mL/min (ref >60)
Glucose, Bld: 95 mg/dL (ref 70–99)
Potassium: 4.7 mmol/L (ref 3.5–5.1)
Sodium: 135 mmol/L (ref 135–145)

## 2020-08-26 ENCOUNTER — Other Ambulatory Visit
Admission: RE | Admit: 2020-08-26 | Discharge: 2020-08-26 | Disposition: A | Discharge status: Home / Self Care | Payer: Medicaid Other | Source: Ambulatory Visit | Attending: Physician Assistant | Admitting: Physician Assistant

## 2020-08-26 DIAGNOSIS — J9612 Chronic respiratory failure with hypercapnia: Secondary | ICD-10-CM | POA: Diagnosis present

## 2020-08-26 DIAGNOSIS — J9611 Chronic respiratory failure with hypoxia: Secondary | ICD-10-CM | POA: Diagnosis present

## 2020-08-26 LAB — BASIC METABOLIC PANEL
Anion gap: 10 (ref 5–15)
BUN: 31 mg/dL — ABNORMAL HIGH (ref 6–20)
CO2: 37 mmol/L — ABNORMAL HIGH (ref 22–32)
Calcium: 9.3 mg/dL (ref 8.9–10.3)
Chloride: 88 mmol/L — ABNORMAL LOW (ref 98–111)
Creatinine, Ser: 0.5 mg/dL (ref 0.44–1.00)
GFR calc non Af Amer: >60 mL/min (ref >60)
Glucose, Bld: 102 mg/dL — ABNORMAL HIGH (ref 70–99)
Potassium: 4.5 mmol/L (ref 3.5–5.1)
Sodium: 135 mmol/L (ref 135–145)

## 2020-08-27 ENCOUNTER — Other Ambulatory Visit: Payer: Medicaid Other | Admitting: Nurse Practitioner

## 2020-08-27 ENCOUNTER — Encounter: Payer: Self-pay | Admitting: Nurse Practitioner

## 2020-08-27 ENCOUNTER — Other Ambulatory Visit: Payer: Self-pay

## 2020-08-27 DIAGNOSIS — Z515 Encounter for palliative care: Secondary | ICD-10-CM

## 2020-08-27 DIAGNOSIS — G809 Cerebral palsy, unspecified: Secondary | ICD-10-CM

## 2020-08-27 NOTE — Progress Notes (Signed)
    Therapist, nutritional Palliative Care Consult Note Telephone: [phone]  Fax: [phone]  PATIENT NAME: Kristi Woods PAT DOB: 06/02/2001 MRN: [id number]  PRIMARY CARE PROVIDER:   System, Provider Not In  REFERRING PROVIDER:  Lynne Leader, NP [address] [place],  [place] [address]  RESPONSIBLE PARTY:   Mother, Kristi Woods  Due to the COVID-19 crisis, this visit was done via telemedicine from my office and it was initiated and consent by this patient and or family.  RECOMMENDATIONS and PLAN: 1.ACP: Discussed code status; DNR and placed in Epic/Vynca; will mail blank most form for review;   [name] endorses wishes are to keep Aneesa at home with conservative interventions, including ventilator, antibiotics if necessary  2.Palliative care encounter; Palliative medicine team will continue to support patient, patient's family, and medical team. Visit consisted of counseling and education dealing with the complex and emotionally intense issues of symptom management and palliative care in the setting of serious and potentially life-threatening illness .  3. F/u 2 months or sooner if declines for ongoing monitoring disease progression  I spent 50 minutes providing this consultation,  from 10:00am to 10:50am. More than 50% of the time in this consultation was spent coordinating communication.   HISTORY OF PRESENT ILLNESS:  Kristi Woods is a 19 y.o. 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 In-person palliative care follow-up visit change to telemedicine telephonic is video not available. [name], Ms. Delamater mother in agreement. We talked about how Ms. Chuck has been feeling. [name] endorses it appears Ms. Mendibles has been feeling little better. Ms. Clemence possibly had covid last week, o2 sats running  low, having to increase her oxygen. They had a caregiver that did have covid that was caring for Ms. Bottcher. [name] endorses not able to get a test done at home or an inperson appointment. [name] endorses it seems like the shortness of breath has improved in symptoms have resolved. We talked about her feedings. Ms. Hosein has not had any residuals. Bowel regimen seems to have improved. We talked about no recent falls, hospitalizations, wounds. We talked about Ms. Snell mood. [name] endorses she feels like the antidepressant is helping. Ms. Placide has not been grimacing and appears comfortable in her chair. [name] endorsed as they continue to get her up daily. We talked about medical goals of care. [name] endorses no new changes or concerns. We talked about follow-up palliative care visit. [name] in agreement, schedule for 2 months if needed or sooner should she declined. Therapeutic listening and emotional support provided. Questions answered the satisfaction. Contact information  Palliative Care was asked to help to continue to address goals of care.   CODE STATUS: DNR  PPS: 30% HOSPICE ELIGIBILITY/DIAGNOSIS: TBD  PAST MEDICAL HISTORY:  Past Medical History:  Diagnosis Date  . Cerebral palsy (HCC)     SOCIAL HX:  Social History   Tobacco Use  . Smoking status: Not on file  Substance Use Topics  . Alcohol use: Not on file    ALLERGIES: No Known Allergies   PERTINENT MEDICATIONS:  No outpatient encounter medications on file as of 08/27/2020.   No facility-administered encounter medications on file as of 08/27/2020.    PHYSICAL EXAM:  Deferred  Garnetta Fedrick Z Marquice Uddin, NP

## 2020-08-27 NOTE — Addendum Note (Signed)
Addended by: Ivery Quale on: 08/27/2020 05:12 PM   Modules accepted: Level of Service

## 2020-09-02 ENCOUNTER — Other Ambulatory Visit
Admission: RE | Admit: 2020-09-02 | Discharge: 2020-09-02 | Disposition: A | Discharge status: Home / Self Care | Payer: Medicaid Other | Source: Other Acute Inpatient Hospital | Attending: Physician Assistant | Admitting: Physician Assistant

## 2020-09-02 DIAGNOSIS — R633 Feeding difficulties, unspecified: Secondary | ICD-10-CM | POA: Diagnosis not present

## 2020-09-02 DIAGNOSIS — J9611 Chronic respiratory failure with hypoxia: Secondary | ICD-10-CM | POA: Diagnosis not present

## 2020-09-02 DIAGNOSIS — J9612 Chronic respiratory failure with hypercapnia: Secondary | ICD-10-CM | POA: Insufficient documentation

## 2020-09-02 LAB — BASIC METABOLIC PANEL
Anion gap: 17 — ABNORMAL HIGH (ref 5–15)
BUN: 36 mg/dL — ABNORMAL HIGH (ref 6–20)
CO2: 35 mmol/L — ABNORMAL HIGH (ref 22–32)
Calcium: 9.2 mg/dL (ref 8.9–10.3)
Chloride: 87 mmol/L — ABNORMAL LOW (ref 98–111)
Creatinine, Ser: 0.66 mg/dL (ref 0.44–1.00)
GFR, Estimated: >60 mL/min (ref >60)
Glucose, Bld: 140 mg/dL — ABNORMAL HIGH (ref 70–99)
Potassium: 4 mmol/L (ref 3.5–5.1)
Sodium: 139 mmol/L (ref 135–145)

## 2020-09-23 ENCOUNTER — Other Ambulatory Visit
Admission: RE | Admit: 2020-09-23 | Discharge: 2020-09-23 | Disposition: A | Discharge status: Home / Self Care | Payer: Medicaid Other | Source: Ambulatory Visit | Attending: Physician Assistant | Admitting: Physician Assistant

## 2020-09-23 DIAGNOSIS — R633 Feeding difficulties, unspecified: Secondary | ICD-10-CM | POA: Diagnosis not present

## 2020-09-23 LAB — BASIC METABOLIC PANEL
Anion gap: 12 (ref 5–15)
BUN: 34 mg/dL — ABNORMAL HIGH (ref 6–20)
CO2: 37 mmol/L — ABNORMAL HIGH (ref 22–32)
Calcium: 9.5 mg/dL (ref 8.9–10.3)
Chloride: 88 mmol/L — ABNORMAL LOW (ref 98–111)
Creatinine, Ser: 0.5 mg/dL (ref 0.44–1.00)
GFR, Estimated: >60 mL/min (ref >60)
Glucose, Bld: 125 mg/dL — ABNORMAL HIGH (ref 70–99)
Potassium: 3.8 mmol/L (ref 3.5–5.1)
Sodium: 137 mmol/L (ref 135–145)

## 2020-10-21 ENCOUNTER — Other Ambulatory Visit
Admission: RE | Admit: 2020-10-21 | Discharge: 2020-10-21 | Disposition: A | Discharge status: Home / Self Care | Payer: Medicaid Other | Source: Ambulatory Visit | Attending: Physician Assistant | Admitting: Physician Assistant

## 2020-10-21 DIAGNOSIS — J9612 Chronic respiratory failure with hypercapnia: Secondary | ICD-10-CM | POA: Diagnosis present

## 2020-10-21 DIAGNOSIS — J9611 Chronic respiratory failure with hypoxia: Secondary | ICD-10-CM | POA: Diagnosis present

## 2020-10-21 DIAGNOSIS — R633 Feeding difficulties, unspecified: Secondary | ICD-10-CM | POA: Diagnosis present

## 2020-10-21 LAB — BASIC METABOLIC PANEL
Anion gap: 13 (ref 5–15)
BUN: 37 mg/dL — ABNORMAL HIGH (ref 6–20)
CO2: 39 mmol/L — ABNORMAL HIGH (ref 22–32)
Calcium: 9.3 mg/dL (ref 8.9–10.3)
Chloride: 89 mmol/L — ABNORMAL LOW (ref 98–111)
Creatinine, Ser: 0.49 mg/dL (ref 0.44–1.00)
GFR, Estimated: >60 mL/min (ref >60)
Glucose, Bld: 101 mg/dL — ABNORMAL HIGH (ref 70–99)
Potassium: 4 mmol/L (ref 3.5–5.1)
Sodium: 141 mmol/L (ref 135–145)

## 2020-10-22 ENCOUNTER — Encounter: Payer: Self-pay | Admitting: Nurse Practitioner

## 2020-10-22 ENCOUNTER — Other Ambulatory Visit: Payer: Medicaid Other | Admitting: Nurse Practitioner

## 2020-10-22 ENCOUNTER — Other Ambulatory Visit: Payer: Self-pay

## 2020-10-22 DIAGNOSIS — G809 Cerebral palsy, unspecified: Secondary | ICD-10-CM

## 2020-10-22 DIAGNOSIS — Z515 Encounter for palliative care: Secondary | ICD-10-CM

## 2020-10-22 NOTE — Progress Notes (Signed)
Therapist, nutritional Palliative Care Consult Note Telephone: [phone]  Fax: [phone]  PATIENT NAME: Kristi Woods DOB: [birth year] MRN: [id number]  PRIMARY CARE PROVIDER:   Wilber Bihari, PA RESPONSIBLE PARTY:Mother, Theodosia Quay  Due to the COVID-19 crisis, this visit was done via telemedicine from my office and it was initiated and consent by this patient and or family.  RECOMMENDATIONS and PLAN: 1.YTK:ZSWFUXNAT code status; DNR in Epic/Vynca  [name] endorses wishes are to keep Tabithia at home with conservative interventions, including ventilator, antibiotics if necessary  2.Palliative care encounter; Palliative medicine team will continue to support patient, patient's family, and medical team. Visit consisted of counseling and education dealing with the complex and emotionally intense issues of symptom management and palliative care in the setting of serious and potentially life-threatening illness .  3. F/u 2 months or sooner if declines for ongoing monitoring disease progression  I spent 50 minutes providing this consultation,  from 8:30am to 9:20am. More than 50% of the time in this consultation was spent coordinating communication.   HISTORY OF PRESENT ILLNESS:  TYEISHA Woods is a 19 y.o. 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 Telemedicine telephonic is video not available follow-up palliative care visit for Ms. Demicco. I called Theodosia Quay, Ms. Solorio mother. We talked about purpose of Palliative care visit. [name] in agreement. We talked about how Ms. Geraci has been doing. [name] endorses Ms. Krogh has been stable. Ms. Yebra does continue to have an occasional desaturation though not as consistently as previously. We talked about tube feedings without any difficulty or  residuals. We talked about symptoms of pain, shortness of breath that she appears comfortable. We talked about getting up out of bed into her chair which Ms. Degraff has been doing and tolerating. [name] endorsed is it does not appear that Ms. Ransom is frowning or appears to be in any type of pain. We talked about recent vacation [name] was able to take for 2 days and have some down time. [name] endorses she was able to go to [facility] by herself was able to get some rest. We talked about importance of self-care, coping strategies. [name] endorses there continues to be difficulty with caregivers in nursing. The nurse they had coming did not work out so back to full care with little relief. We talked about upcoming appointment with Behavioral Health. [name] endorses [employer] is supposed to call today to set up a time for telemedicine visit. We talked about quality of life, praised [name] for taking such good care of Ms. Beske. We talked about follow up Palliative care visit in 2 months if needed or sooner should Ms. Schmall declines. [name] in agreement, appointments scheduled. Therapeutic listening, emotional support, most of visit supportive. No changes in medical goals of care reviewed. Questions answered to satisfaction. Contact information.  Palliative Care was asked to help to continue to address goals of care.   CODE STATUS: DNR  PPS: 30% HOSPICE ELIGIBILITY/DIAGNOSIS: TBD  PAST MEDICAL HISTORY:  Past Medical History:  Diagnosis Date  . Cerebral palsy (HCC)     SOCIAL HX:  Social History   Tobacco Use  . Smoking status: Not on file  Substance Use Topics  . Alcohol use: Not on file    ALLERGIES: No Known Allergies   PERTINENT MEDICATIONS:  No outpatient encounter medications on file as of 10/22/2020.  No facility-administered encounter medications on file as of 10/22/2020.    PHYSICAL EXAM:  Deferred  Khrystian Schauf Z Dyanne Yorks, NP

## 2020-11-18 ENCOUNTER — Other Ambulatory Visit
Admission: RE | Admit: 2020-11-18 | Discharge: 2020-11-18 | Disposition: A | Discharge status: Home / Self Care | Payer: Medicaid Other | Source: Ambulatory Visit | Attending: Physician Assistant | Admitting: Physician Assistant

## 2020-11-18 DIAGNOSIS — R633 Feeding difficulties, unspecified: Secondary | ICD-10-CM | POA: Diagnosis not present

## 2020-11-18 LAB — BASIC METABOLIC PANEL
Anion gap: 13 (ref 5–15)
BUN: 36 mg/dL — ABNORMAL HIGH (ref 6–20)
CO2: 38 mmol/L — ABNORMAL HIGH (ref 22–32)
Calcium: 8.9 mg/dL (ref 8.9–10.3)
Chloride: 88 mmol/L — ABNORMAL LOW (ref 98–111)
Creatinine, Ser: 0.36 mg/dL — ABNORMAL LOW (ref 0.44–1.00)
GFR, Estimated: >60 mL/min (ref >60)
Glucose, Bld: 101 mg/dL — ABNORMAL HIGH (ref 70–99)
Potassium: 4.2 mmol/L (ref 3.5–5.1)
Sodium: 139 mmol/L (ref 135–145)

## 2020-12-08 ENCOUNTER — Other Ambulatory Visit: Payer: Medicaid Other | Admitting: Nurse Practitioner

## 2020-12-08 ENCOUNTER — Encounter: Payer: Self-pay | Admitting: Nurse Practitioner

## 2020-12-08 ENCOUNTER — Telehealth: Payer: Self-pay | Admitting: Nurse Practitioner

## 2020-12-08 ENCOUNTER — Other Ambulatory Visit: Payer: Self-pay

## 2020-12-08 DIAGNOSIS — Z515 Encounter for palliative care: Secondary | ICD-10-CM

## 2020-12-08 DIAGNOSIS — G809 Cerebral palsy, unspecified: Secondary | ICD-10-CM

## 2020-12-08 NOTE — Telephone Encounter (Signed)
I called [name], Ms. Ebey mother to confirm PC f/u visit today, no answer, message left with contact information

## 2020-12-08 NOTE — Progress Notes (Signed)
Therapist, nutritional Palliative Care Consult Note Telephone: [phone]  Fax: [phone]  PATIENT NAME: Kristi Woods DOB: Mar 24, 2001 MRN: [id number]  PRIMARY CARE PROVIDER:   Wilber Bihari, PA  REFERRING PROVIDER:  Wilber Bihari, PA [address] [place],  [place] [address]  RESPONSIBLE PARTY:Mother, Theodosia Quay  Due to the COVID-19 crisis, this visit was done via telemedicine from my office and it was initiated and consent by this patient and or family.  RECOMMENDATIONS and PLAN: 1.ZPH:XTAVWPVXY code status; DNR in Epic/Vynca  Kristi Woods endorses wishes are to keep Mailynn at home with conservative interventions, including ventilator, antibiotics if necessary  2.Palliative care encounter; Palliative medicine team will continue to support patient, patient's family, and medical team. Visit consisted of counseling and education dealing with the complex and emotionally intense issues of symptom management and palliative care in the setting of serious and potentially life-threatening illness.  3. F/u 1 months or sooner if declines for ongoing monitoring disease progression  I spent 35 minutes providing this consultation,  from 11:30am to 12:05pm. More than 50% of the time in this consultation was spent coordinating communication.   HISTORY OF PRESENT ILLNESS:  [name] is a 20 y.o. 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 I called Kristi Seta, [name] for Palliative care follow-up visit telemedicine telephonic is video not available. we talked about how [name] has been doing. [name] endorses [name] has been having good and bad days. We talked about on the good day she does continue to get up to the to her chair for several hours a day. Ms. Heacock does continue to be sleepy as with  the effexor and trazodone at night. [name] endorses [name] was not sleeping at night without the trazodone. Effexor started for Ms Mccullum grimacing and crying while she was in her chair which seems to have resolved with the effexor. Effexor did resolve the symptoms and she can tolerate her chair longer. The side effect is just as sleepiness. We talked about benefits outweighing risks of comfort. We talked about currently she is back down to 2L oxygen. Ms. Smick has been having episodes for days at a time where they have to increase her oxygen to 8 liters. This seems to be a pattern of cycle over the last several weeks at a time. Kristi Woods endorses there has been no sign of infection or reason behind why she would be sat in the 80s to which they would have to increase her oxygen. [name] endorses at these periods of time when this happens [name] does continue to appear comfortable. We talked about her feedings, about regiment. We talked about medical goals of care including aggressive versus conservative versus comfort care. We talked at length about when is it time to take her back to the hospital for low SATs. [name] and I talked about if they were to call primary or the team at [facility] they would be instructed to return her back to [facility] for hospitalization. We talked at length about different scenarios for rehospitalization. We talked about comfort care at home. We talked at length about hospice philosophy, hospice benefit under Medicare and what would be provided. We talked about the ventilator and peep what that is used for. [name] endorses her ventilator settings the peep is pretty high and cannot be increased per what she was told. We  talked about disease progression. We talked about realistic expectations. We talked about Kristi Woods wanting [name] to pass at home. We talked about having Hospice Physicians review case for eligibility. [name] in agreement. Follow up palliative care visit scheduled. We talked  about role of Palliative care and plan of care. [name] and I talked at length about disease progression, realistic expectations, moving to the next phase of the disease process and wishes for care for Comfort. We talked about what all of that would look like. We talked about coping strategies. Encourage to further discuss with team for what she does talk to every week about possibly transition to Hospice if found eligible. Kristi Woods endorses she and her husband will need to further discuss wishes for care as this would be a big step for them. Kristi Woods endorses their wishes are to keep her at home and out of the hospital although if there is something to treat wishes are to continue to treat at this time.  Palliative Care was asked to help to continue address goals of care.   CODE STATUS: DNR  PPS: 30% HOSPICE ELIGIBILITY/DIAGNOSIS: TBD  PAST MEDICAL HISTORY:  Past Medical History:  Diagnosis Date  . Cerebral palsy (HCC)     SOCIAL HX:  Social History   Tobacco Use  . Smoking status: Not on file  . Smokeless tobacco: Not on file  Substance Use Topics  . Alcohol use: Not on file    ALLERGIES: No Known Allergies   PERTINENT MEDICATIONS:  No outpatient encounter medications on file as of 12/08/2020.   No facility-administered encounter medications on file as of 12/08/2020.    PHYSICAL EXAM:  Deferred  Cabot Cromartie Z Dayshon Roback, NP

## 2020-12-16 ENCOUNTER — Other Ambulatory Visit
Admission: RE | Admit: 2020-12-16 | Discharge: 2020-12-16 | Disposition: A | Discharge status: Home / Self Care | Payer: Medicaid Other | Source: Ambulatory Visit | Attending: Physician Assistant | Admitting: Physician Assistant

## 2020-12-16 DIAGNOSIS — R633 Feeding difficulties, unspecified: Secondary | ICD-10-CM | POA: Insufficient documentation

## 2020-12-16 DIAGNOSIS — J9612 Chronic respiratory failure with hypercapnia: Secondary | ICD-10-CM | POA: Diagnosis present

## 2020-12-16 LAB — BASIC METABOLIC PANEL
Anion gap: 13 (ref 5–15)
BUN: 32 mg/dL — ABNORMAL HIGH (ref 6–20)
CO2: 33 mmol/L — ABNORMAL HIGH (ref 22–32)
Calcium: 9.5 mg/dL (ref 8.9–10.3)
Chloride: 90 mmol/L — ABNORMAL LOW (ref 98–111)
Creatinine, Ser: 0.51 mg/dL (ref 0.44–1.00)
GFR, Estimated: >60 mL/min (ref >60)
Glucose, Bld: 106 mg/dL — ABNORMAL HIGH (ref 70–99)
Potassium: 5 mmol/L (ref 3.5–5.1)
Sodium: 136 mmol/L (ref 135–145)

## 2021-01-12 ENCOUNTER — Encounter: Payer: Self-pay | Admitting: Nurse Practitioner

## 2021-01-12 ENCOUNTER — Other Ambulatory Visit: Payer: Self-pay

## 2021-01-12 ENCOUNTER — Other Ambulatory Visit: Payer: Medicaid Other | Admitting: Nurse Practitioner

## 2021-01-12 DIAGNOSIS — Z515 Encounter for palliative care: Secondary | ICD-10-CM

## 2021-01-12 DIAGNOSIS — G809 Cerebral palsy, unspecified: Secondary | ICD-10-CM

## 2021-01-12 NOTE — Progress Notes (Signed)
Therapist, nutritional Palliative Care Consult Note Telephone: [phone]  Fax: [phone]  PATIENT NAME: Kristi Woods DOB: 11/28/2000 MRN: [id number]  PRIMARY CARE PROVIDER:   Wilber Bihari, PA  REFERRING PROVIDER:  Wilber Bihari, PA [address] [place],  [place] [address]  RESPONSIBLE PARTY:Mother, Theodosia Quay  Due to the COVID-19 crisis, this visit was done via telemedicine from my office and it was initiated and consent by this patient and or family.  RECOMMENDATIONS and PLAN: 1.DZH:GDJMEQAST code status; DNR in Epic/Vynca; Ongoing discussions about transitioning to Hospice program/[facility] program  [name] endorses wishes are to keep Sharise at home with conservative interventions, including ventilator, antibiotics if necessary  2.Palliative care encounter; Palliative medicine team will continue to support patient, patient's family, and medical team. Visit consisted of counseling and education dealing with the complex and emotionally intense issues of symptom management and palliative care in the setting of serious and potentially life-threatening illness  I spent 40 minutes providing this consultation,  from 9:45am to 10:25am. More than 50% of the time in this consultation was spent coordinating communication.   HISTORY OF PRESENT ILLNESS:  Kristi Woods is a 20 y.o. 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 I called Thetis, Schwimmer mother to confirm Palliative care follow-up visit. Message left and [name] return call sharing that she wished to change to telemedicine telephonic as video not available. We talked about how Cordia has been been doing. [name] endorses Coreen continues to be more difficult to ventilate her over that last 2 weeks. [name] endorses that she had a  discussion with the respiratory therapist thats visits. Reyonna's peep is at 10 and she is on BiPAP settings on the ventilator more so than a ventilator. [name] endorses they have an appointment with Pulmonology tomorrow to review to see if it would be more beneficial to change the settings to accommodate for comfort. [name] endorses it has been very hard to ventilate Elizabet. [name] endorses when Brie becomes constipated it is hard to ventilate as well. [name] endorses they have been working with due care team to regulate her bowels. We talked about no other changes at this time. We talked about medical goals of care with wishes to focus more on comfort at home. We talked about Hospice. Discuss with [name] case reviewed among Hospice physicians. We talked about the difference between Pediatric Hospice and Adult Hospice. We talked about Dr. Artis Flock Hospice Physician for kids path in agreement with Hospice eligibility under [facility] program secondary to Cerebral Palsy. [name] and I talked about what services will be offered. We talked about the ventilator, equipment which could pose a challenge. Discussed with Sandria Manly Social Worker can contact for review of case and further discussion about ventilator with insurance coverage and options. [name] in agreement to have [employer] contact for further discussion. [name] endorse is she is very interested in [facility]/Hospice program. We talked about Pulmonology visit tomorrow. We talked about challenges with transport, [name] endorses she tried to do telemedicine but they wish to see Hetty in person. We talked about chronic disease progression. We talked about role of Palliative care with plan of care. Discussed with [name] will recontact to schedule follow-up Palliative care visit if the decision is made not to go with kids path. [name] in agreement. Therapeutic listening, emotional support provided. Contact information. Questions answered  to satisfaction.  Palliative Care was asked to help to continue to address goals of care.   CODE STATUS: DNR  PPS: 30% HOSPICE ELIGIBILITY/DIAGNOSIS: TBD  PAST MEDICAL HISTORY:  Past Medical History:  Diagnosis Date  . Cerebral palsy (HCC)     SOCIAL HX:  Social History   Tobacco Use  . Smoking status: Not on file  . Smokeless tobacco: Not on file  Substance Use Topics  . Alcohol use: Not on file    ALLERGIES: No Known Allergies   PERTINENT MEDICATIONS:  No outpatient encounter medications on file as of 01/12/2021.   No facility-administered encounter medications on file as of 01/12/2021.    PHYSICAL EXAM:   Deferred  Amorette Charrette Z Anakaren Campion, NP

## 2021-01-14 ENCOUNTER — Telehealth: Payer: Self-pay

## 2021-01-14 NOTE — Telephone Encounter (Signed)
Received call from [facility] from [facility] regarding transitioning patient to Hospice services. Would like to speak to Palliative care NP. Message sent to Christin Gussler with contact information

## 2021-01-19 ENCOUNTER — Other Ambulatory Visit
Admission: RE | Admit: 2021-01-19 | Discharge: 2021-01-19 | Disposition: A | Discharge status: Home / Self Care | Payer: Medicaid Other | Source: Ambulatory Visit | Attending: Physician Assistant | Admitting: Physician Assistant

## 2021-01-19 DIAGNOSIS — R633 Feeding difficulties, unspecified: Secondary | ICD-10-CM | POA: Insufficient documentation

## 2021-01-19 LAB — BASIC METABOLIC PANEL
Anion gap: 11 (ref 5–15)
BUN: 36 mg/dL — ABNORMAL HIGH (ref 6–20)
CO2: 33 mmol/L — ABNORMAL HIGH (ref 22–32)
Calcium: 9.4 mg/dL (ref 8.9–10.3)
Chloride: 92 mmol/L — ABNORMAL LOW (ref 98–111)
Creatinine, Ser: 0.5 mg/dL (ref 0.44–1.00)
GFR, Estimated: >60 mL/min (ref >60)
Glucose, Bld: 102 mg/dL — ABNORMAL HIGH (ref 70–99)
Potassium: 3.6 mmol/L (ref 3.5–5.1)
Sodium: 136 mmol/L (ref 135–145)

## 2021-02-02 ENCOUNTER — Other Ambulatory Visit
Admission: RE | Admit: 2021-02-02 | Discharge: 2021-02-02 | Disposition: A | Discharge status: Home / Self Care | Payer: Medicaid Other | Source: Ambulatory Visit | Attending: Physician Assistant | Admitting: Physician Assistant

## 2021-02-02 DIAGNOSIS — J9612 Chronic respiratory failure with hypercapnia: Secondary | ICD-10-CM | POA: Insufficient documentation

## 2021-02-02 DIAGNOSIS — J9611 Chronic respiratory failure with hypoxia: Secondary | ICD-10-CM | POA: Insufficient documentation

## 2021-02-02 LAB — BASIC METABOLIC PANEL
Anion gap: 13 (ref 5–15)
BUN: 36 mg/dL — ABNORMAL HIGH (ref 6–20)
CO2: 30 mmol/L (ref 22–32)
Calcium: 9.7 mg/dL (ref 8.9–10.3)
Chloride: 95 mmol/L — ABNORMAL LOW (ref 98–111)
Creatinine, Ser: 0.57 mg/dL (ref 0.44–1.00)
GFR, Estimated: >60 mL/min (ref >60)
Glucose, Bld: 114 mg/dL — ABNORMAL HIGH (ref 70–99)
Potassium: 3.8 mmol/L (ref 3.5–5.1)
Sodium: 138 mmol/L (ref 135–145)

## 2021-02-11 ENCOUNTER — Other Ambulatory Visit: Payer: Self-pay | Admitting: Pediatrics

## 2021-02-23 ENCOUNTER — Other Ambulatory Visit: Payer: Self-pay | Admitting: Pediatrics

## 2021-03-10 ENCOUNTER — Other Ambulatory Visit: Payer: Self-pay | Admitting: Pediatrics

## 2021-03-21 DEATH — deceased

## 2021-03-25 ENCOUNTER — Encounter (INDEPENDENT_AMBULATORY_CARE_PROVIDER_SITE_OTHER): Payer: Self-pay
